# Patient Record
Sex: Male | Born: 1995 | Race: Black or African American | Hispanic: No | Marital: Single | Smoking: Never smoker
Health system: Southern US, Community
[De-identification: ages and names within clinical notes are randomized; demographics above are authoritative.]

## PROBLEM LIST (undated history)

## (undated) DIAGNOSIS — R569 Unspecified convulsions: Secondary | ICD-10-CM

## (undated) DIAGNOSIS — F32A Depression, unspecified: Secondary | ICD-10-CM

## (undated) DIAGNOSIS — F329 Major depressive disorder, single episode, unspecified: Secondary | ICD-10-CM

## (undated) DIAGNOSIS — R040 Epistaxis: Secondary | ICD-10-CM

## (undated) DIAGNOSIS — J45909 Unspecified asthma, uncomplicated: Secondary | ICD-10-CM

---
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
## 2016-02-04 ENCOUNTER — Encounter (HOSPITAL_COMMUNITY): Payer: Self-pay | Admitting: Emergency Medicine

## 2016-02-04 ENCOUNTER — Ambulatory Visit (HOSPITAL_COMMUNITY)
Admission: EM | Admit: 2016-02-04 | Discharge: 2016-02-04 | Disposition: A | Discharge status: Home / Self Care | Payer: Medicaid Other | Attending: Family Medicine | Admitting: Family Medicine

## 2016-02-04 DIAGNOSIS — A084 Viral intestinal infection, unspecified: Secondary | ICD-10-CM | POA: Diagnosis not present

## 2016-02-04 MED ORDER — ONDANSETRON 4 MG PO TBDP
4.0000 mg | ORAL_TABLET | Freq: Once | ORAL | Status: AC
  Administered 2016-02-04: 4 mg via ORAL

## 2016-02-04 MED ORDER — ONDANSETRON 4 MG PO TBDP: 4.0000 mg | ORAL_TABLET | Freq: Three times a day (TID) | ORAL | 0 refills | Status: DC | PRN

## 2016-02-04 MED ORDER — ONDANSETRON 4 MG PO TBDP
ORAL_TABLET | ORAL | Status: AC
  Filled 2016-02-04: qty 1

## 2016-02-04 NOTE — ED Provider Notes (Signed)
CSN: [id number]     Arrival date & time 02/04/16  1149 History   None    Chief Complaint  Patient presents with  . Nausea   (Consider location/radiation/quality/duration/timing/severity/associated sxs/prior Treatment) Patient c/o nausea and vomiting and weakness x 1 day.    Emesis  Severity:  Moderate Duration:  1 day Timing:  Constant Quality:  Undigested food Able to tolerate:  Liquids and solids How soon after eating does vomiting occur:  1 hour Progression:  Worsening Chronicity:  New Recent urination:  Normal Relieved by:  Nothing Worsened by:  Nothing Ineffective treatments:  None tried   Past Medical History:  Diagnosis Date  . Asthma   . Bleeding from the nose   . Seizures (HCC)    History reviewed. No pertinent surgical history. History reviewed. No pertinent family history. Social History  Substance Use Topics  . Smoking status: Passive Smoke Exposure - Never Smoker  . Smokeless tobacco: Never Used  . Alcohol use No    Review of Systems  Constitutional: Positive for fatigue.  HENT: Negative.   Eyes: Negative.   Respiratory: Negative.   Cardiovascular: Negative.   Gastrointestinal: Positive for nausea and vomiting.  Endocrine: Negative.   Genitourinary: Negative.   Musculoskeletal: Negative.   Skin: Negative.   Allergic/Immunologic: Negative.   Neurological: Negative.   Hematological: Negative.   Psychiatric/Behavioral: Negative.     Allergies  Patient has no known allergies.  Home Medications   Prior to Admission medications   Medication Sig Start Date End Date Taking? Authorizing Provider  clonazePAM (KLONOPIN) 0.5 MG tablet Take 0.5 tablets (0.25 mg total) by mouth at bedtime. Patient not taking: Reported on 08/28/2015 05/22/14 08/28/15  Tamika Bush, DO  ibuprofen (ADVIL,MOTRIN) 800 MG tablet Take 1 tablet (800 mg total) by mouth 3 (three) times daily. 08/29/15   Gilda Creasehristopher J Pollina, MD  naproxen (NAPROSYN) 500 MG tablet Take 1 tablet  (500 mg total) by mouth 2 (two) times daily. 12/25/15   Hope Orlene OchM Neese, NP  traMADol (ULTRAM) 50 MG tablet Take 1 tablet (50 mg total) by mouth every 6 (six) hours as needed. 08/29/15   Gilda Creasehristopher J Pollina, MD   Meds Ordered and Administered this Visit  Medications - No data to display  BP 108/72 (BP Location: Left Arm)   Pulse 93   Temp 98 F (36.7 C) (Oral)   Resp 16   SpO2 96%  No data found.   Physical Exam  Constitutional: He is oriented to person, place, and time. He appears well-developed and well-nourished.  HENT:  Head: Normocephalic and atraumatic.  Mouth/Throat: Oropharynx is clear and moist.  Eyes: EOM are normal. Pupils are equal, round, and reactive to light.  Neck: Normal range of motion. Neck supple.  Cardiovascular: Normal rate, regular rhythm and normal heart sounds.   Pulmonary/Chest: Effort normal.  Abdominal: Soft.  Bowel sounds hyperactive.  Musculoskeletal: Normal range of motion.  Neurological: He is alert and oriented to person, place, and time.  Nursing note and vitals reviewed.   Urgent Care Course   Clinical Course     Procedures (including critical care time)  Labs Review Labs Reviewed - No data to display  Imaging Review No results found.   Visual Acuity Review  Right Eye Distance:   Left Eye Distance:   Bilateral Distance:    Right Eye Near:   Left Eye Near:    Bilateral Near:         MDM  Viral Gastroenteritis Zofran odt 4mg   given here in clinic Zofran 4mg  one po tid prn #21 Push po fluids, rest, tylenol and motrin otc prn as directed for fever, arthralgias, and myalgias.  Follow up prn if sx's continue or persist.    Deatra CanterWilliam J Oxford, FNP 02/04/16 1747

## 2016-02-04 NOTE — ED Triage Notes (Signed)
[facility] with a complaint of N/V/D that started at 2:30 am this date. The patient denied any abdominal pain.

## 2016-09-03 ENCOUNTER — Emergency Department (HOSPITAL_COMMUNITY)
Admission: EM | Admit: 2016-09-03 | Discharge: 2016-09-03 | Disposition: A | Discharge status: Home / Self Care | Payer: Medicaid Other | Attending: Emergency Medicine | Admitting: Emergency Medicine

## 2016-09-03 ENCOUNTER — Encounter (HOSPITAL_COMMUNITY): Payer: Self-pay | Admitting: Emergency Medicine

## 2016-09-03 DIAGNOSIS — F4320 Adjustment disorder, unspecified: Secondary | ICD-10-CM | POA: Insufficient documentation

## 2016-09-03 DIAGNOSIS — Z639 Problem related to primary support group, unspecified: Secondary | ICD-10-CM | POA: Insufficient documentation

## 2016-09-03 DIAGNOSIS — Z7722 Contact with and (suspected) exposure to environmental tobacco smoke (acute) (chronic): Secondary | ICD-10-CM | POA: Diagnosis not present

## 2016-09-03 DIAGNOSIS — R45851 Suicidal ideations: Secondary | ICD-10-CM | POA: Insufficient documentation

## 2016-09-03 DIAGNOSIS — J45909 Unspecified asthma, uncomplicated: Secondary | ICD-10-CM | POA: Insufficient documentation

## 2016-09-03 NOTE — ED Notes (Signed)
Belongings placed in locker #29. 

## 2016-09-03 NOTE — ED Provider Notes (Signed)
[facility]-EMERGENCY DEPT Provider Note   CSN: [id number] Arrival date & time: 09/03/16  1720     History   Chief Complaint Chief Complaint  Patient presents with  . Suicidal    HPI Ruben Stone is a 21 y.o. male.  HPI   Patient reports that he has had a difficult relationship for his father for years, and today had a moment when he felt all of his emotions come to a head, began to cry about his relationship with his dad which he reports he had never done until today.  Reports he was upset and went for a walk to think, be outside, and sat on the curb by a bridge. Reports bystanders saw him crying in that location and called [employer], telling him they wanted him to be safe. Reports he was not having suicidal thoughts, however he was grateful to the bystanders because he knows they were looking out for him.  Reports if he feels upset he has coping mechanisms such as playing sports, going for walk, and playing instruments.  Reports no hx of depression, no prior SI, no mental health problems/hospitalization.  Denies medical concerns.  Past Medical History:  Diagnosis Date  . Asthma   . Bleeding from the nose   . Seizures (HCC)     There are no active problems to display for this patient.   History reviewed. No pertinent surgical history.     Home Medications    Prior to Admission medications   Medication Sig Start Date End Date Taking? Authorizing Provider  clonazePAM (KLONOPIN) 0.5 MG tablet Take 0.5 tablets (0.25 mg total) by mouth at bedtime. Patient not taking: Reported on 08/28/2015 05/22/14 08/28/15  Truddie Coco, DO    Family History No family history on file.  Social History Social History  Substance Use Topics  . Smoking status: Passive Smoke Exposure - Never Smoker  . Smokeless tobacco: Never Used  . Alcohol use No     Allergies   Patient has no known allergies.   Review of Systems Review of Systems  Constitutional: Negative for fever.  HENT: Negative for sore  throat.   Eyes: Negative for visual disturbance.  Respiratory: Negative for shortness of breath.   Cardiovascular: Negative for chest pain.  Gastrointestinal: Negative for abdominal pain.  Genitourinary: Negative for difficulty urinating.  Musculoskeletal: Negative for back pain and neck stiffness.  Skin: Negative for rash.  Neurological: Negative for syncope and headaches.  Psychiatric/Behavioral: Negative for self-injury and suicidal ideas.     Physical Exam Updated Vital Signs BP 117/74 (BP Location: Left Arm)   Pulse 76   Temp 98 F (36.7 C) (Oral)   Resp 20   Ht 6\' 1"  (1.854 m)   Wt 59 kg (130 lb)   SpO2 100%   BMI 17.15 kg/m   Physical Exam  Constitutional: He is oriented to person, place, and time. He appears well-developed and well-nourished. No distress.  HENT:  Head: Normocephalic and atraumatic.  Eyes: Conjunctivae and EOM are normal.  Neck: Normal range of motion.  Cardiovascular: Normal rate, regular rhythm, normal heart sounds and intact distal pulses.  Exam reveals no gallop and no friction rub.   No murmur heard. Pulmonary/Chest: Effort normal and breath sounds normal. No respiratory distress. He has no wheezes. He has no rales.  Abdominal: Soft. He exhibits no distension. There is no tenderness. There is no guarding.  Musculoskeletal: He exhibits no edema.  Neurological: He is alert and oriented to person, place, and  time.  Skin: Skin is warm and dry. He is not diaphoretic.  Nursing note and vitals reviewed.    ED Treatments / Results  Labs (all labs ordered are listed, but only abnormal results are displayed) Labs Reviewed - No data to display  EKG  EKG Interpretation None       Radiology No results found.  Procedures Procedures (including critical care time)  Medications Ordered in ED Medications - No data to display   Initial Impression / Assessment and Plan / ED Course  I have reviewed the triage vital signs and the nursing  notes.  Pertinent labs & imaging results that were available during my care of the patient were reviewed by me and considered in my medical decision making (see chart for details).    21yo male with history of asthma, seizures presents after [employer] was called as he was crying on a bridge.  Patient reports he was upset over relationship with his father and was going for a walk.  Denies SI, has no prior hx of SI, no prior hx of attempts or hospitalizations.  Denies SI and also reports motivating factors to live, and appropriate coping mechanisms. Do not feel he is a threat to himself.  Patient contracts for safety. Patient discharged in stable condition with understanding of reasons to return.    Final Clinical Impressions(s) / ED Diagnoses   Final diagnoses:  Emotional crisis  Family dysfunction    New Prescriptions Discharge Medication List as of 09/03/2016  7:56 PM       Alvira MondaySchlossman, [place], MD 09/04/16 1358

## 2016-09-03 NOTE — ED Triage Notes (Signed)
[place] [employer] [place]  Patient reports he was lost and was having a "break down" but is denying suicidal thoughts or plans.  Patient reports he is having a bad relationship with his father and his father is saying things to him that should not be said to a child.  Patient denies HI, SI, and AVH.  Patient calm and cooperative.

## 2016-09-03 NOTE — ED Notes (Signed)
Bed: WA29 Expected date:  Expected time:  Means of arrival:  Comments: 

## 2016-09-03 NOTE — ED Notes (Signed)
Dr. Dalene SeltzerSchlossman in with pt.

## 2017-04-02 ENCOUNTER — Other Ambulatory Visit: Payer: Self-pay

## 2017-04-02 ENCOUNTER — Encounter (HOSPITAL_COMMUNITY): Payer: Self-pay | Admitting: Emergency Medicine

## 2017-04-02 DIAGNOSIS — G40909 Epilepsy, unspecified, not intractable, without status epilepticus: Secondary | ICD-10-CM | POA: Diagnosis present

## 2017-04-02 DIAGNOSIS — F329 Major depressive disorder, single episode, unspecified: Secondary | ICD-10-CM | POA: Diagnosis present

## 2017-04-02 DIAGNOSIS — J45909 Unspecified asthma, uncomplicated: Secondary | ICD-10-CM | POA: Diagnosis present

## 2017-04-02 DIAGNOSIS — K529 Noninfective gastroenteritis and colitis, unspecified: Principal | ICD-10-CM | POA: Diagnosis present

## 2017-04-02 MED ORDER — ONDANSETRON 4 MG PO TBDP: 4.0000 mg | ORAL_TABLET | Freq: Once | ORAL | Status: DC | PRN

## 2017-04-02 NOTE — ED Triage Notes (Signed)
Patient is complaining of abdominal pain. Patient has no vomiting, or diarrhea. Patient is having some nausea. This started an hour ago.

## 2017-04-03 ENCOUNTER — Encounter (HOSPITAL_COMMUNITY): Payer: Self-pay | Admitting: Radiology

## 2017-04-03 ENCOUNTER — Inpatient Hospital Stay (HOSPITAL_COMMUNITY)
Admission: EM | Admit: 2017-04-03 | Discharge: 2017-04-04 | DRG: 392 | Disposition: A | Discharge status: Home / Self Care | Payer: Medicaid Other | Attending: Internal Medicine | Admitting: Internal Medicine

## 2017-04-03 ENCOUNTER — Emergency Department (HOSPITAL_COMMUNITY): Payer: Medicaid Other

## 2017-04-03 DIAGNOSIS — R1115 Cyclical vomiting syndrome unrelated to migraine: Secondary | ICD-10-CM

## 2017-04-03 DIAGNOSIS — J45909 Unspecified asthma, uncomplicated: Secondary | ICD-10-CM | POA: Diagnosis present

## 2017-04-03 DIAGNOSIS — R4182 Altered mental status, unspecified: Secondary | ICD-10-CM

## 2017-04-03 DIAGNOSIS — R569 Unspecified convulsions: Secondary | ICD-10-CM

## 2017-04-03 DIAGNOSIS — Z87898 Personal history of other specified conditions: Secondary | ICD-10-CM

## 2017-04-03 DIAGNOSIS — K529 Noninfective gastroenteritis and colitis, unspecified: Secondary | ICD-10-CM | POA: Diagnosis not present

## 2017-04-03 DIAGNOSIS — R1110 Vomiting, unspecified: Secondary | ICD-10-CM | POA: Diagnosis present

## 2017-04-03 DIAGNOSIS — J4520 Mild intermittent asthma, uncomplicated: Secondary | ICD-10-CM

## 2017-04-03 DIAGNOSIS — R109 Unspecified abdominal pain: Secondary | ICD-10-CM | POA: Diagnosis present

## 2017-04-03 DIAGNOSIS — F329 Major depressive disorder, single episode, unspecified: Secondary | ICD-10-CM | POA: Diagnosis present

## 2017-04-03 DIAGNOSIS — G40909 Epilepsy, unspecified, not intractable, without status epilepticus: Secondary | ICD-10-CM | POA: Diagnosis present

## 2017-04-03 HISTORY — DX: Depression, unspecified: F32.A

## 2017-04-03 HISTORY — DX: Major depressive disorder, single episode, unspecified: F32.9

## 2017-04-03 LAB — RAPID URINE DRUG SCREEN, HOSP PERFORMED
Amphetamines: NOT DETECTED
BARBITURATES: NOT DETECTED
Benzodiazepines: NOT DETECTED
Cocaine: NOT DETECTED
Opiates: POSITIVE — AB
Tetrahydrocannabinol: POSITIVE — AB

## 2017-04-03 LAB — COMPREHENSIVE METABOLIC PANEL
ALK PHOS: 58 U/L (ref 38–126)
ALT: 22 U/L (ref 17–63)
AST: 38 U/L (ref 15–41)
Albumin: 5 g/dL (ref 3.5–5.0)
Anion gap: 12 (ref 5–15)
BUN: 13 mg/dL (ref 6–20)
CALCIUM: 10 mg/dL (ref 8.9–10.3)
CO2: 22 mmol/L (ref 22–32)
CREATININE: 1.11 mg/dL (ref 0.61–1.24)
Chloride: 103 mmol/L (ref 101–111)
GFR calc Af Amer: >60 mL/min (ref >60)
GFR calc non Af Amer: >60 mL/min (ref >60)
Glucose, Bld: 127 mg/dL — ABNORMAL HIGH (ref 65–99)
Potassium: 3.3 mmol/L — ABNORMAL LOW (ref 3.5–5.1)
SODIUM: 137 mmol/L (ref 135–145)
Total Bilirubin: 1.2 mg/dL (ref 0.3–1.2)
Total Protein: 7.9 g/dL (ref 6.5–8.1)

## 2017-04-03 LAB — URINALYSIS, ROUTINE W REFLEX MICROSCOPIC
BILIRUBIN URINE: NEGATIVE
GLUCOSE, UA: NEGATIVE mg/dL
HGB URINE DIPSTICK: NEGATIVE
KETONES UR: NEGATIVE mg/dL
Leukocytes, UA: NEGATIVE
Nitrite: NEGATIVE
PROTEIN: NEGATIVE mg/dL
Specific Gravity, Urine: 1.012 (ref 1.005–1.030)
pH: 6 (ref 5.0–8.0)

## 2017-04-03 LAB — CBC
HCT: 43.2 % (ref 39.0–52.0)
Hemoglobin: 15.8 g/dL (ref 13.0–17.0)
MCH: 30.6 pg (ref 26.0–34.0)
MCHC: 36.6 g/dL — AB (ref 30.0–36.0)
MCV: 83.7 fL (ref 78.0–100.0)
PLATELETS: 195 10*3/uL (ref 150–400)
RBC: 5.16 MIL/uL (ref 4.22–5.81)
RDW: 11.2 % — AB (ref 11.5–15.5)
WBC: 19 10*3/uL — ABNORMAL HIGH (ref 4.0–10.5)

## 2017-04-03 LAB — ETHANOL

## 2017-04-03 LAB — LIPASE, BLOOD: Lipase: 28 U/L (ref 11–51)

## 2017-04-03 MED ORDER — SODIUM CHLORIDE 0.9% FLUSH: 3.0000 mL | INTRAVENOUS | Status: DC | PRN

## 2017-04-03 MED ORDER — SODIUM CHLORIDE 0.9 % IV SOLN
INTRAVENOUS | Status: DC
  Administered 2017-04-03 – 2017-04-04 (×3): via INTRAVENOUS

## 2017-04-03 MED ORDER — IOPAMIDOL (ISOVUE-300) INJECTION 61%
100.0000 mL | Freq: Once | INTRAVENOUS | Status: AC | PRN
  Administered 2017-04-03: 100 mL via INTRAVENOUS

## 2017-04-03 MED ORDER — SODIUM CHLORIDE 0.9 % IV SOLN: 250.0000 mL | INTRAVENOUS | Status: DC | PRN

## 2017-04-03 MED ORDER — METRONIDAZOLE IN NACL 5-0.79 MG/ML-% IV SOLN
500.0000 mg | Freq: Once | INTRAVENOUS | Status: AC
  Administered 2017-04-03: 500 mg via INTRAVENOUS
  Filled 2017-04-03: qty 100

## 2017-04-03 MED ORDER — ACETAMINOPHEN 325 MG PO TABS: 650.0000 mg | ORAL_TABLET | Freq: Four times a day (QID) | ORAL | Status: DC | PRN

## 2017-04-03 MED ORDER — IOPAMIDOL (ISOVUE-300) INJECTION 61%
INTRAVENOUS | Status: AC
  Filled 2017-04-03: qty 100

## 2017-04-03 MED ORDER — SODIUM CHLORIDE 0.9 % IV BOLUS (SEPSIS)
1000.0000 mL | Freq: Once | INTRAVENOUS | Status: AC
  Administered 2017-04-03: 1000 mL via INTRAVENOUS

## 2017-04-03 MED ORDER — LACTATED RINGERS IV BOLUS (SEPSIS)
1000.0000 mL | Freq: Once | INTRAVENOUS | Status: AC
  Administered 2017-04-03: 1000 mL via INTRAVENOUS

## 2017-04-03 MED ORDER — ONDANSETRON HCL 4 MG PO TABS: 4.0000 mg | ORAL_TABLET | Freq: Four times a day (QID) | ORAL | Status: DC | PRN

## 2017-04-03 MED ORDER — TRAZODONE HCL 50 MG PO TABS: 50.0000 mg | ORAL_TABLET | Freq: Every evening | ORAL | Status: DC | PRN

## 2017-04-03 MED ORDER — MORPHINE SULFATE (PF) 2 MG/ML IV SOLN
2.0000 mg | Freq: Once | INTRAVENOUS | Status: AC
  Administered 2017-04-03: 2 mg via INTRAVENOUS
  Filled 2017-04-03: qty 1

## 2017-04-03 MED ORDER — LORAZEPAM 2 MG/ML IJ SOLN
1.0000 mg | Freq: Once | INTRAMUSCULAR | Status: AC
  Administered 2017-04-03: 1 mg via INTRAVENOUS
  Filled 2017-04-03: qty 1

## 2017-04-03 MED ORDER — MORPHINE SULFATE (PF) 4 MG/ML IV SOLN
4.0000 mg | Freq: Once | INTRAVENOUS | Status: AC
  Administered 2017-04-03: 4 mg via INTRAVENOUS
  Filled 2017-04-03: qty 1

## 2017-04-03 MED ORDER — LACTATED RINGERS IV BOLUS (SEPSIS): 1000.0000 mL | Freq: Three times a day (TID) | INTRAVENOUS | Status: DC | PRN

## 2017-04-03 MED ORDER — ONDANSETRON HCL 4 MG/2ML IJ SOLN
4.0000 mg | Freq: Once | INTRAMUSCULAR | Status: AC
  Administered 2017-04-03: 4 mg via INTRAVENOUS
  Filled 2017-04-03: qty 2

## 2017-04-03 MED ORDER — CLONAZEPAM 0.5 MG PO TABS
0.2500 mg | ORAL_TABLET | Freq: Every day | ORAL | Status: DC
  Administered 2017-04-03: 21:00:00 0.25 mg via ORAL
  Filled 2017-04-03: qty 1

## 2017-04-03 MED ORDER — SENNA 8.6 MG PO TABS
1.0000 | ORAL_TABLET | Freq: Two times a day (BID) | ORAL | Status: DC
  Administered 2017-04-03 – 2017-04-04 (×3): 8.6 mg via ORAL
  Filled 2017-04-03 (×2): qty 1

## 2017-04-03 MED ORDER — FLUTICASONE PROPIONATE 50 MCG/ACT NA SUSP
1.0000 | Freq: Every day | NASAL | Status: DC | PRN
  Filled 2017-04-03: qty 16

## 2017-04-03 MED ORDER — MORPHINE SULFATE (PF) 2 MG/ML IV SOLN: 2.0000 mg | INTRAVENOUS | Status: DC | PRN

## 2017-04-03 MED ORDER — ONDANSETRON HCL 4 MG/2ML IJ SOLN: 4.0000 mg | Freq: Four times a day (QID) | INTRAMUSCULAR | Status: DC | PRN

## 2017-04-03 MED ORDER — TRAZODONE HCL 100 MG PO TABS
100.0000 mg | ORAL_TABLET | Freq: Every day | ORAL | Status: DC
  Administered 2017-04-03: 100 mg via ORAL
  Filled 2017-04-03: qty 1

## 2017-04-03 MED ORDER — SODIUM CHLORIDE 0.9% FLUSH
3.0000 mL | Freq: Two times a day (BID) | INTRAVENOUS | Status: DC
  Administered 2017-04-03 (×2): 3 mL via INTRAVENOUS

## 2017-04-03 MED ORDER — ALBUTEROL SULFATE (2.5 MG/3ML) 0.083% IN NEBU: 2.5000 mg | INHALATION_SOLUTION | RESPIRATORY_TRACT | Status: DC | PRN

## 2017-04-03 MED ORDER — HEPARIN SODIUM (PORCINE) 5000 UNIT/ML IJ SOLN
5000.0000 [IU] | Freq: Three times a day (TID) | INTRAMUSCULAR | Status: DC
  Administered 2017-04-03 (×2): 5000 [IU] via SUBCUTANEOUS
  Filled 2017-04-03 (×2): qty 1

## 2017-04-03 MED ORDER — METRONIDAZOLE IN NACL 5-0.79 MG/ML-% IV SOLN
500.0000 mg | Freq: Three times a day (TID) | INTRAVENOUS | Status: DC
  Administered 2017-04-03 – 2017-04-04 (×3): 500 mg via INTRAVENOUS
  Filled 2017-04-03 (×4): qty 100

## 2017-04-03 MED ORDER — ACETAMINOPHEN 650 MG RE SUPP: 650.0000 mg | Freq: Four times a day (QID) | RECTAL | Status: DC | PRN

## 2017-04-03 MED ORDER — POLYETHYLENE GLYCOL 3350 17 G PO PACK: 17.0000 g | PACK | Freq: Every day | ORAL | Status: DC | PRN

## 2017-04-03 MED ORDER — PROMETHAZINE HCL 25 MG/ML IJ SOLN
25.0000 mg | Freq: Once | INTRAMUSCULAR | Status: AC
  Administered 2017-04-03: 25 mg via INTRAVENOUS
  Filled 2017-04-03: qty 1

## 2017-04-03 MED ORDER — DEXTROSE 5 % IV SOLN
2.0000 g | Freq: Once | INTRAVENOUS | Status: AC
  Administered 2017-04-03: 2 g via INTRAVENOUS
  Filled 2017-04-03: qty 2

## 2017-04-03 MED ORDER — LEVOFLOXACIN IN D5W 750 MG/150ML IV SOLN
750.0000 mg | INTRAVENOUS | Status: DC
Start: 2017-04-03 — End: 2017-04-04
  Administered 2017-04-03 – 2017-04-04 (×2): 750 mg via INTRAVENOUS
  Filled 2017-04-03 (×2): qty 150

## 2017-04-03 MED ORDER — LORATADINE 10 MG PO TABS: 10.0000 mg | ORAL_TABLET | Freq: Every day | ORAL | Status: DC | PRN

## 2017-04-03 MED ORDER — METOCLOPRAMIDE HCL 5 MG/ML IJ SOLN
10.0000 mg | Freq: Once | INTRAMUSCULAR | Status: AC
  Administered 2017-04-03: 10 mg via INTRAVENOUS
  Filled 2017-04-03: qty 2

## 2017-04-03 MED ORDER — LACTATED RINGERS IV SOLN
INTRAVENOUS | Status: DC
  Administered 2017-04-03: 09:00:00 via INTRAVENOUS

## 2017-04-03 MED ORDER — CLONAZEPAM 0.5 MG PO TABS: 0.2500 mg | ORAL_TABLET | Freq: Every day | ORAL | Status: DC

## 2017-04-03 MED ORDER — OXYCODONE HCL 5 MG PO TABS: 5.0000 mg | ORAL_TABLET | ORAL | Status: DC | PRN

## 2017-04-03 NOTE — Plan of Care (Signed)
Cathie HoopsRichard Simeone is a 22 year old male with pmh depression, seizures, asthma; who presented with abdominal pain persistent bilious emesis.  VS relatively stable.  Labs revealed WBC 19 and potassium 3.3.  CT scan of the abdomen revealed signs of possible colitis versus early appendicitis.  Dr. Michaell CowingGross of general surgery had been consulted by the ED provider.  Patient was started on empiric antibiotics of Flagyl and ceftriaxone.  [facility] called to admit.  May need to reconsult surgery in a.m. Admit to a MedSurg bed.

## 2017-04-03 NOTE — Consult Note (Addendum)
[place]  [address] [place], [place] [address] Phone: [phone] FAX: [phone]     Ruben Stone  1995/03/17 [id number]  CARE TEAM:  PCP: Patient, No Pcp Per  Outpatient Care Team: Patient Care Team: Patient, No Pcp Per as PCP - General (General Practice)  Inpatient Treatment Team: Treatment Team: Attending Provider: Delora Fuel, MD; Physician Assistant: Agapito Games; Registered Nurse: Markus Daft, RN; Consulting Physician: Edison Pace, Md, MD   This patient is a 23 y.o.male who presents today for surgical evaluation at the request of [facility].   Chief complaint / Reason for evaluation: Abdominal pain.  Possible appendicitis  22 year old young male comes in with crampy abdominal pain persists.  History of history of seizure disorder and.  History of depression.  Had sudden onset of diffuse abdominal pain.  Crankiness.  Began to have emesis.  Patient not really able to give me a history according to physician assistant he had oral intake.  He did drink some liquor, at least 2 shots, before he started vomiting.  He has had negative alcohol levels the past 2 years.  He comes to the emergency room a couple times a year.  Apparently no one else is sick.  No radical change in his diet.  Pain became more intense.  Apparently no fevers or chills.  No headache or neck pain.  No difficulty breathing or chest pain.  No dizziness or syncope.  Came to emergency room.  On initial evaluation had crampy abdominal pain.  Seem more focused in the right lower side.  CT scan showing some edema and lymphadenopathy.  Very thin body habitus so hard to see appendix well.  Possible thickening and inflammation.  Could not rule out appendicitis.  Given the strong clinical suspicion, surgical consultation recommended.  When I came to see him he was sleeping soundly.  He was slow to arouse, resistant being woken up.  Eventually he did  wake up enough.  He then sat up and stood up, trying to ignore me.  He staggered over and urinated in the sink and staggered back into bed.  I was there to try and help steady him and make sure he would not fall.  Patient denied any abdominal pain.  No guarding in his gait.  No difficulty with movement.   Assessment  [place]  22 y.o. male       Problem List:  Active Problems:   Abdominal pain   Emesis   Altered mental status, unspecified   Crampy abdominal pain that seems to have resolved.  History of alcohol intake.  Concerning for alcohol intoxication.  Plan:  He has no abdominal pain.  No guarding.  No discomfort.  In the absence of any pain or guarding or tenderness over McBurney point, I am skeptical that he really has appendicitis.  I would lean more towards mesenteric adenitis, food poisoning, alcohol poisoning, or gastroenteritis, but he does not seem to have much discomfort now.  Should he have more focused right-sided abdominal pain, can revisit the hypothesis of appendicitis, but appendicitis progresses and worsens.  It does not resolve.  We will sign off.  Reconsult as needed.  Depressed altered mental status concerning for some level of intoxication.  Seems a challenge in a patient with history of seizures.  Defer emergency department  to rule out atypical seizure.  Consider alcohol and drug level checks and reevaluation.  Make sure there is nothing else  going on.    No evidence of any injury.  No focal neurological deficits concerning for stroke or injury  IV fluids.  Probably observe until he recovers or declares his diagnosis better  Discussed with Dr. Roxanne Mins in the emergency department.    45 minutes spent in review, evaluation, examination, counseling, and coordination of care.  More than 50% of that time was spent in counseling.  [place], M.D., F.A.C.S. Gastrointestinal and Minimally Invasive Surgery [facility] [address], [place], [place] [address] [phone] Main / Paging   04/03/2017      Past Medical History:  Diagnosis Date  . Asthma   . Bleeding from the nose   . Seizures ([place])     History reviewed. No pertinent surgical history.  Social History   Socioeconomic History  . Marital status: Single    Spouse name: Not on file  . Number of children: Not on file  . Years of education: Not on file  . Highest education level: Not on file  Social Needs  . Financial resource strain: Not on file  . Food insecurity - worry: Not on file  . Food insecurity - inability: Not on file  . Transportation needs - medical: Not on file  . Transportation needs - non-medical: Not on file  Occupational History  . Not on file  Tobacco Use  . Smoking status: Passive Smoke Exposure - Never Smoker  . Smokeless tobacco: Never Used  Substance and Sexual Activity  . Alcohol use: No  . Drug use: No  . Sexual activity: Not on file  Other Topics Concern  . Not on file  Social History Narrative  . Not on file    History reviewed. No pertinent family history.  Current Facility-Administered Medications  Medication Dose Route Frequency Provider Last Rate Last Dose  . cefTRIAXone (ROCEPHIN) 2 g in dextrose 5 % 50 mL IVPB  2 g Intravenous Once Muthersbaugh, Jarrett Soho, PA-C       And  . metroNIDAZOLE (FLAGYL) IVPB 500 mg  500 mg Intravenous Once Muthersbaugh, Hannah, PA-C      . lactated ringers bolus 1,000 mL  1,000 mL Intravenous Once Chandell Attridge, Remo Lipps, MD      . lactated ringers bolus 1,000 mL  1,000 mL Intravenous Q8H PRN Michael [place], MD       Current Outpatient Medications  Medication Sig Dispense Refill  . fluticasone (FLONASE) 50 MCG/ACT nasal spray Place 1 spray into both nostrils daily as needed for allergies or rhinitis.    Marland Kitchen loratadine (CLARITIN) 10 MG tablet Take 10 mg by mouth daily as needed for allergies.    . clonazePAM (KLONOPIN) 0.5 MG tablet Take 0.5 tablets (0.25 mg total) by  mouth at bedtime. (Patient not taking: Reported on 08/28/2015) 7 tablet 0     No Known Allergies  ROS:   As noted per HPI.  Patient would not answer questions related to review of systems, but he denied abdominal pain.  Denies any diarrhea.  Denies any sick contacts.  Denied any subjective fevers or chills.  Denies any nausea at the time..  Defer to review systems provided by nursing and physician assistant on earlier evaluation.   BP 139/86 (BP Location: Left Arm)   Pulse 75   Temp 99.4 F (37.4 C) (Oral)   Resp 18   Ht 6' (1.829 m)   Wt 63.5 kg (140 lb)   SpO2 100%   BMI 18.99 kg/m  Physical Exam: General: Patient soundly sleeping.  Eventually was able to arouse.  Oriented to self.  Groggy but appears to be in no acute distress.  Looks very drunk Eyes: PERRL, normal EOM. Sclera little injected but nonicteric Neuro: CN II-XII intact w/o focal sensory/motor deficits.  Unsteady gait suspicious for intoxication. Lymph: No head/neck/groin lymphadenopathy Psych:  No delerium/psychosis/paranoia.  Withdrawn.  Not particularly interactive but does eventually answer questions. HENT: Normocephalic, Mucus membranes dry.  No thrush Neck: Supple, No tracheal deviation Chest: No pain.  Good respiratory excursion. CV:  Pulses intact.  Regular rhythm Abdomen: Soft, Nondistended.  Nontender.  No guarding with bed shake or standing up.  No pain to light or deep palpation or percussion.  No rebound tenderness.  No umbilical hernia.  No incarcerated hernias. Gen:  No inguinal hernias.  No inguinal lymphadenopathy.  Urinates without difficulty. Ext:  SCDs BLE.  No significant edema.  No cyanosis Skin: No petechiae / purpurea.  No major sores Musculoskeletal: No severe joint pain.  Good ROM major joints   Results:   Labs: Results for orders placed or performed during the hospital encounter of 04/03/17 (from the past 48 hour(s))  Lipase, blood     Status: None   Collection Time: 04/03/17 12:46 AM   Result Value Ref Range   Lipase 28 11 - 51 U/L  Comprehensive metabolic panel     Status: Abnormal   Collection Time: 04/03/17 12:46 AM  Result Value Ref Range   Sodium 137 135 - 145 mmol/L   Potassium 3.3 (L) 3.5 - 5.1 mmol/L   Chloride 103 101 - 111 mmol/L   CO2 22 22 - 32 mmol/L   Glucose, Bld 127 (H) 65 - 99 mg/dL   BUN 13 6 - 20 mg/dL   Creatinine, Ser 1.11 0.61 - 1.24 mg/dL   Calcium 10.0 8.9 - 10.3 mg/dL   Total Protein 7.9 6.5 - 8.1 g/dL   Albumin 5.0 3.5 - 5.0 g/dL   AST 38 15 - 41 U/L   ALT 22 17 - 63 U/L   Alkaline Phosphatase 58 38 - 126 U/L   Total Bilirubin 1.2 0.3 - 1.2 mg/dL   GFR calc non Af Amer >60 >60 mL/min   GFR calc Af Amer >60 >60 mL/min    Comment: (NOTE) The eGFR has been calculated using the CKD EPI equation. This calculation has not been validated in all clinical situations. eGFR's persistently <60 mL/min signify possible Chronic Kidney Disease.    Anion gap 12 5 - 15  CBC     Status: Abnormal   Collection Time: 04/03/17 12:46 AM  Result Value Ref Range   WBC 19.0 (H) 4.0 - 10.5 K/uL   RBC 5.16 4.22 - 5.81 MIL/uL   Hemoglobin 15.8 13.0 - 17.0 g/dL   HCT 43.2 39.0 - 52.0 %   MCV 83.7 78.0 - 100.0 fL   MCH 30.6 26.0 - 34.0 pg   MCHC 36.6 (H) 30.0 - 36.0 g/dL   RDW 11.2 (L) 11.5 - 15.5 %   Platelets 195 150 - 400 K/uL    Imaging / Studies: Ct Abdomen Pelvis W Contrast  Result Date: 04/03/2017 CLINICAL DATA:  22 year old male with abdominal pain and nausea. EXAM: CT ABDOMEN AND PELVIS WITH CONTRAST TECHNIQUE: Multidetector CT imaging of the abdomen and pelvis was performed using the standard protocol following bolus administration of intravenous contrast. CONTRAST:  129m ISOVUE-300 IOPAMIDOL (ISOVUE-300) INJECTION 61% COMPARISON:  Abdominal CT dated 08/29/2015 FINDINGS: Lower chest: The  visualized lung bases are clear. No intra-abdominal free air. There is a small free fluid within the pelvis. Hepatobiliary: No focal liver abnormality is seen.  No gallstones, gallbladder wall thickening, or biliary dilatation. Pancreas: Unremarkable. No pancreatic ductal dilatation or surrounding inflammatory changes. Spleen: Normal in size without focal abnormality. Adrenals/Urinary Tract: The adrenal glands, kidneys, and the visualized ureters appear unremarkable. There is apparent diffuse thickening of the bladder wall which may be partly related to underdistention. Cystitis is not excluded. Correlation with urinalysis recommended. Stomach/Bowel: Evaluation of the bowel is limited in the absence of oral contrast. Diffusely thickened appearance of the colon, likely related to underdistention. Colitis is less likely. Clinical correlation is recommended. There is no bowel obstruction. The appendix is suboptimally visualized. There appears to be a small amount of fluid in the visualized proximal and midportion of the appendix. The visualized appendix measure up to 9 mm. The distal portion of the appendix is poorly visualized but appear to extend is posteriorly along the right lateral pelvic sidewall. There is mild stranding of the fat surrounding the appendix which may be related to general mesenteric edema seen throughout the abdomen. An early acute appendicitis is not excluded. Clinical correlation is recommended. Vascular/Lymphatic: No significant vascular findings are present. No enlarged abdominal or pelvic lymph nodes. Reproductive: The prostate and seminal vesicles are grossly unremarkable. Other: None Musculoskeletal: No acute or significant osseous findings. IMPRESSION: Mild diffuse mesenteric edema and small amount of free fluid within the pelvis of indeterminate etiology. Apparent mild diffuse thickening of the colon likely related to underdistention. Clinical correlation is recommended to exclude colitis. No bowel obstruction. Suboptimal evaluation of the appendix. Mild periappendiceal stranding possibly secondary to diffuse mesenteric edema. An early acute  appendicitis is not entirely excluded. Clinical correlation is recommended. Electronically Signed   By: Anner Crete M.D.   On: 04/03/2017 02:53    Medications / Allergies: per chart  Antibiotics: Anti-infectives (From admission, onward)   Start     Dose/Rate Route Frequency Ordered Stop   04/03/17 0530  cefTRIAXone (ROCEPHIN) 2 g in dextrose 5 % 50 mL IVPB     2 g 100 mL/hr over 30 Minutes Intravenous  Once 04/03/17 0521     04/03/17 0530  metroNIDAZOLE (FLAGYL) IVPB 500 mg     500 mg 100 mL/hr over 60 Minutes Intravenous  Once 04/03/17 9741          Note: Portions of this report may have been transcribed using voice recognition software. Every effort was made to ensure accuracy; however, inadvertent computerized transcription errors may be present.   Any transcriptional errors that result from this process are unintentional.    [place], M.D., F.A.C.S. Gastrointestinal and Minimally Invasive Surgery [facility] [address], [place], [place] [address] [phone] Main / Paging   04/03/2017

## 2017-04-03 NOTE — ED Notes (Signed)
Report called top [employer] RN

## 2017-04-03 NOTE — ED Notes (Signed)
[facility] Expected date:  Expected time:  Means of arrival:  Comments: HELD

## 2017-04-03 NOTE — ED Notes (Signed)
PATIENT NOT COOPERATIVE TWO ATTEMPTS TO COLLECT BLOOD SAMPLES BOTH WERE SUCCESSFUL PATIENT PULLS AWAY PULLING NEEDLE OUT BOTH TIMES

## 2017-04-03 NOTE — H&P (Signed)
Patient Demographics:    Ruben Stone, is a 22 y.o. male  MRN: [id number]   DOB - 19-May-1995  Admit Date - 04/03/2017  Outpatient Primary MD for the patient is Patient, No Pcp Per   Assessment & Plan:    Principal Problem:   Abdominal pain Active Problems:   Emesis   Altered mental status, unspecified   History of seizures    1)Abd Pain-concerns about possible acute colitis, leukocytosis noted, surgical consult from Dr. Michaell CowingGross appreciated, check serial CBCs, serial abdominal exams, at this time low index of suspicion for acute appendicitis, treat empirically with Levaquin and Flagyl for possible colitis.  Blood cultures ordered but patient has not been cooperative with blood draws  2)H/o Depression-affect is flat, patient denies suicidal or homicidal ideation or plan, patient is not often cooperative with staff, He is not violent , he is not agitated, continue Klonopin and trazodone    With History of - Reviewed by me  Past Medical History:  Diagnosis Date  . Asthma   . Bleeding from the nose   . Depression   . Seizures (HCC)       History reviewed. No pertinent surgical history.    Chief Complaint  Patient presents with  . Abdominal Pain      HPI:    Ruben Stone  is a 22 y.o. male with past medical history relevant for depression, seizure disorder tobacco and THC use who presents to the ED with sudden onset of abdominal pain couple of hours prior to arrival in the ED. abdominal pain started after patient drank 2 shots of liquor.  She denies excessive alcohol use on a regular basis patient had few episodes of bilious emesis, no blood.  Denies diarrhea, no sick contacts at home, no fevers or chills  In ED... Patient is found to have a white count of 19,000, CT abdomen and pelvis with concerns about  possible colitis and inability to rule out acute appendicitis, surgical consult obtained by ED provider Dr. Michaell CowingGross from general surgery believes there is little likelihood of acute appendicitis .  At the time of my evaluation abdominal pain is much improved, patient is intermittently cooperative, he often refuses blood draws  In ED... Patient was started empirically on Flagyl and Rocephin  CT Abd/Pelvis shows- Mild diffuse mesenteric edema and small amount of free fluid within the pelvis of indeterminate etiology. Apparent mild diffuse thickening of the colon likely related to underdistention. Clinical correlation is recommended to exclude colitis. No bowel obstruction. Suboptimal evaluation of the appendix.   Review of systems:    In addition to the HPI above,   A full 12 point Review of 10 Systems was done, except as stated above, all other Review of 10 Systems were negative.   Social History:  Reviewed by me   Social History   Tobacco Use  . Smoking status: Passive Smoke Exposure - Never Smoker  . Smokeless tobacco: Never Used  Substance Use Topics  . Alcohol use: No    Family History :  Reviewed by me   HTN   Home Medications:   Prior to Admission medications   Medication Sig Start Date End Date Taking? Authorizing Provider  fluticasone (FLONASE) 50 MCG/ACT nasal spray Place 1 spray into both nostrils daily as needed for allergies or rhinitis.   Yes [provider]  loratadine (CLARITIN) 10 MG tablet Take 10 mg by mouth daily as needed for allergies.   Yes [provider]  clonazePAM (KLONOPIN) 0.5 MG tablet Take 0.5 tablets (0.25 mg total) by mouth at bedtime. Patient not taking: Reported on 08/28/2015 05/22/14 08/28/15  Truddie Coco, DO     Allergies:    No Known Allergies   Physical Exam:   Vitals  Blood pressure 117/64, pulse 75, temperature 99.4 F (37.4 C), temperature source Oral, resp. rate 14, height 6' (1.829 m), weight 63.5 kg (140 lb),  SpO2 100 %.  Physical Examination: General appearance - alert, well appearing, and in no distress  Mental status - alert, oriented to person, place, and time,  Eyes - sclera anicteric Neck - supple, no JVD elevation , Chest - clear  to auscultation bilaterally, symmetrical air movement,  Heart - S1 and S2 normal,  Abdomen - soft,  nondistended, no masses or organomegaly, almost completely resolved lower abdominal and epigastric area tenderness, no rebound or guarding, no CVA area tenderness Neurological - screening mental status exam normal, neck supple without rigidity, cranial nerves II through XII intact, DTR's normal and symmetric Extremities - no pedal edema noted, intact peripheral pulses  Skin - warm, dry Psych-affect is flat, patient is not always cooperative   Data Review:    CBC Recent Labs  Lab 04/03/17 0046  WBC 19.0*  HGB 15.8  HCT 43.2  PLT 195  MCV 83.7  MCH 30.6  MCHC 36.6*  RDW 11.2*   ------------------------------------------------------------------------------------------------------------------  Chemistries  Recent Labs  Lab 04/03/17 0046  NA 137  K 3.3*  CL 103  CO2 22  GLUCOSE 127*  BUN 13  CREATININE 1.11  CALCIUM 10.0  AST 38  ALT 22  ALKPHOS 58  BILITOT 1.2   ------------------------------------------------------------------------------------------------------------------ estimated creatinine clearance is 95.3 mL/min (by C-G formula based on SCr of 1.11 mg/dL). ------------------------------------------------------------------------------------------------------------------ No results for input(s): TSH, T4TOTAL, T3FREE, THYROIDAB in the last 72 hours.  Invalid input(s): FREET3   Coagulation profile No results for input(s): INR, PROTIME in the last 168 hours. ------------------------------------------------------------------------------------------------------------------- No results for input(s): DDIMER in the last 72  hours. -------------------------------------------------------------------------------------------------------------------  Cardiac Enzymes No results for input(s): CKMB, TROPONINI, MYOGLOBIN in the last 168 hours.  Invalid input(s): CK ------------------------------------------------------------------------------------------------------------------ No results found for: BNP   ---------------------------------------------------------------------------------------------------------------  Urinalysis    Component Value Date/Time   COLORURINE STRAW (A) 04/03/2017 0817   APPEARANCEUR CLEAR 04/03/2017 0817   LABSPEC 1.012 04/03/2017 0817   PHURINE 6.0 04/03/2017 0817   GLUCOSEU NEGATIVE 04/03/2017 0817   HGBUR NEGATIVE 04/03/2017 0817   BILIRUBINUR NEGATIVE 04/03/2017 0817   KETONESUR NEGATIVE 04/03/2017 0817   PROTEINUR NEGATIVE 04/03/2017 0817   NITRITE NEGATIVE 04/03/2017 0817   LEUKOCYTESUR NEGATIVE 04/03/2017 0817    ----------------------------------------------------------------------------------------------------------------   Imaging Results:    Ct Abdomen Pelvis W Contrast  Result Date: 04/03/2017 CLINICAL DATA:  22 year old male with abdominal pain and nausea. EXAM: CT ABDOMEN AND PELVIS WITH CONTRAST TECHNIQUE: Multidetector CT imaging of the abdomen and pelvis was performed using the standard protocol following bolus administration of intravenous contrast.  CONTRAST:  ISOVUE-300 IOPAMIDOL (ISOVUE-300) INJECTION 61% COMPARISON:  Abdominal CT dated 08/29/2015 FINDINGS: Lower chest: The visualized lung bases are clear. No intra-abdominal free air. There is a small free fluid within the pelvis. Hepatobiliary: No focal liver abnormality is seen. No gallstones, gallbladder wall thickening, or biliary dilatation. Pancreas: Unremarkable. No pancreatic ductal dilatation or surrounding inflammatory changes. Spleen: Normal in size without focal abnormality. Adrenals/Urinary  Tract: The adrenal glands, kidneys, and the visualized ureters appear unremarkable. There is apparent diffuse thickening of the bladder wall which may be partly related to underdistention. Cystitis is not excluded. Correlation with urinalysis recommended. Stomach/Bowel: Evaluation of the bowel is limited in the absence of oral contrast. Diffusely thickened appearance of the colon, likely related to underdistention. Colitis is less likely. Clinical correlation is recommended. There is no bowel obstruction. The appendix is suboptimally visualized. There appears to be a small amount of fluid in the visualized proximal and midportion of the appendix. The visualized appendix measure up to 9 mm. The distal portion of the appendix is poorly visualized but appear to extend is posteriorly along the right lateral pelvic sidewall. There is mild stranding of the fat surrounding the appendix which may be related to general mesenteric edema seen throughout the abdomen. An early acute appendicitis is not excluded. Clinical correlation is recommended. Vascular/Lymphatic: No significant vascular findings are present. No enlarged abdominal or pelvic lymph nodes. Reproductive: The prostate and seminal vesicles are grossly unremarkable. Other: None Musculoskeletal: No acute or significant osseous findings. IMPRESSION: Mild diffuse mesenteric edema and small amount of free fluid within the pelvis of indeterminate etiology. Apparent mild diffuse thickening of the colon likely related to underdistention. Clinical correlation is recommended to exclude colitis. No bowel obstruction. Suboptimal evaluation of the appendix. Mild periappendiceal stranding possibly secondary to diffuse mesenteric edema. An early acute appendicitis is not entirely excluded. Clinical correlation is recommended. Electronically Signed   By: Elgie Collard M.D.   On: 04/03/2017 02:53    Radiological Exams on Admission: Ct Abdomen Pelvis W Contrast  Result  Date: 04/03/2017 CLINICAL DATA:  22 year old male with abdominal pain and nausea. EXAM: CT ABDOMEN AND PELVIS WITH CONTRAST TECHNIQUE: Multidetector CT imaging of the abdomen and pelvis was performed using the standard protocol following bolus administration of intravenous contrast. CONTRAST:  ISOVUE-300 IOPAMIDOL (ISOVUE-300) INJECTION 61% COMPARISON:  Abdominal CT dated 08/29/2015 FINDINGS: Lower chest: The visualized lung bases are clear. No intra-abdominal free air. There is a small free fluid within the pelvis. Hepatobiliary: No focal liver abnormality is seen. No gallstones, gallbladder wall thickening, or biliary dilatation. Pancreas: Unremarkable. No pancreatic ductal dilatation or surrounding inflammatory changes. Spleen: Normal in size without focal abnormality. Adrenals/Urinary Tract: The adrenal glands, kidneys, and the visualized ureters appear unremarkable. There is apparent diffuse thickening of the bladder wall which may be partly related to underdistention. Cystitis is not excluded. Correlation with urinalysis recommended. Stomach/Bowel: Evaluation of the bowel is limited in the absence of oral contrast. Diffusely thickened appearance of the colon, likely related to underdistention. Colitis is less likely. Clinical correlation is recommended. There is no bowel obstruction. The appendix is suboptimally visualized. There appears to be a small amount of fluid in the visualized proximal and midportion of the appendix. The visualized appendix measure up to 9 mm. The distal portion of the appendix is poorly visualized but appear to extend is posteriorly along the right lateral pelvic sidewall. There is mild stranding of the fat surrounding the appendix which may be related to general mesenteric  edema seen throughout the abdomen. An early acute appendicitis is not excluded. Clinical correlation is recommended. Vascular/Lymphatic: No significant vascular findings are present. No enlarged abdominal or  pelvic lymph nodes. Reproductive: The prostate and seminal vesicles are grossly unremarkable. Other: None Musculoskeletal: No acute or significant osseous findings. IMPRESSION: Mild diffuse mesenteric edema and small amount of free fluid within the pelvis of indeterminate etiology. Apparent mild diffuse thickening of the colon likely related to underdistention. Clinical correlation is recommended to exclude colitis. No bowel obstruction. Suboptimal evaluation of the appendix. Mild periappendiceal stranding possibly secondary to diffuse mesenteric edema. An early acute appendicitis is not entirely excluded. Clinical correlation is recommended. Electronically Signed   By: Elgie Collard M.D.   On: 04/03/2017 02:53    DVT Prophylaxis -SCD  /heparin AM Labs Ordered, also please review Full Orders  Family Communication: Admission, patients condition and plan of care including tests being ordered have been discussed with the patient and who indicate understanding and agree with the plan   Code Status - Full Code  Likely DC to  Home   Condition  - stable  Shon Hale M.D on 04/03/2017 at 10:05 AM  Between 7am to 7pm - Pager - [phone] After 7pm go to [email or web address] - password [email or web address]  [facility] - Office  [phone]  Voice Recognition Reubin Milan dictation system was used to create this note, attempts have been made to correct errors. Please contact the author with questions and/or clarifications.

## 2017-04-03 NOTE — ED Notes (Signed)
ED TO INPATIENT HANDOFF REPORT  Name/Age/Gender Ruben Stone 22 y.o. male  Code Status    Code Status Orders  (From admission, onward)        Start     Ordered   04/03/17 1023  Full code  Continuous     04/03/17 1022    Code Status History    Date Active Date Inactive Code Status Order ID Comments User Context   This patient has a current code status but no historical code status.      Home/SNF/Other Home  Chief Complaint stomach problems   Level of Care/Admitting Diagnosis ED Disposition    ED Disposition Condition Comment   Admit  Hospital Area: [facility] [100102]  Level of Care: Med-Surg [16]  Diagnosis: Abdominal pain [716967]  Admitting Physician: Morrison Old  Attending Physician: Morrison Old  Estimated length of stay: 3 - 4 days  Certification:: I certify this patient will need inpatient services for at least 2 midnights  Bed request comments: med  PT Class (Do Not Modify): Inpatient [101]  PT Acc Code (Do Not Modify): Private [1]       Medical History Past Medical History:  Diagnosis Date  . Asthma   . Bleeding from the nose   . Depression   . Seizures ([place])     Allergies No Known Allergies  IV Location/Drains/Wounds Patient Lines/Drains/Airways Status   Active Line/Drains/Airways    Name:   Placement date:   Placement time:   Site:   Days:   Peripheral IV 04/03/17 Right Forearm   04/03/17    0056    Forearm   less than 1          Labs/Imaging Results for orders placed or performed during the hospital encounter of 04/03/17 (from the past 48 hour(s))  Lipase, blood     Status: None   Collection Time: 04/03/17 12:46 AM  Result Value Ref Range   Lipase 28 11 - 51 U/L  Comprehensive metabolic panel     Status: Abnormal   Collection Time: 04/03/17 12:46 AM  Result Value Ref Range   Sodium 137 135 - 145 mmol/L   Potassium 3.3 (L) 3.5 - 5.1 mmol/L   Chloride 103 101 - 111 mmol/L   CO2  22 22 - 32 mmol/L   Glucose, Bld 127 (H) 65 - 99 mg/dL   BUN 13 6 - 20 mg/dL   Creatinine, Ser 1.11 0.61 - 1.24 mg/dL   Calcium 10.0 8.9 - 10.3 mg/dL   Total Protein 7.9 6.5 - 8.1 g/dL   Albumin 5.0 3.5 - 5.0 g/dL   AST 38 15 - 41 U/L   ALT 22 17 - 63 U/L   Alkaline Phosphatase 58 38 - 126 U/L   Total Bilirubin 1.2 0.3 - 1.2 mg/dL   GFR calc non Af Amer >60 >60 mL/min   GFR calc Af Amer >60 >60 mL/min    Comment: (NOTE) The eGFR has been calculated using the CKD EPI equation. This calculation has not been validated in all clinical situations. eGFR's persistently <60 mL/min signify possible Chronic Kidney Disease.    Anion gap 12 5 - 15  CBC     Status: Abnormal   Collection Time: 04/03/17 12:46 AM  Result Value Ref Range   WBC 19.0 (H) 4.0 - 10.5 K/uL   RBC 5.16 4.22 - 5.81 MIL/uL   Hemoglobin 15.8 13.0 - 17.0 g/dL   HCT 43.2 39.0 - 52.0 %  MCV 83.7 78.0 - 100.0 fL   MCH 30.6 26.0 - 34.0 pg   MCHC 36.6 (H) 30.0 - 36.0 g/dL   RDW 11.2 (L) 11.5 - 15.5 %   Platelets 195 150 - 400 K/uL  Urinalysis, Routine w reflex microscopic     Status: Abnormal   Collection Time: 04/03/17  8:17 AM  Result Value Ref Range   Color, Urine STRAW (A) YELLOW   APPearance CLEAR CLEAR   Specific Gravity, Urine 1.012 1.005 - 1.030   pH 6.0 5.0 - 8.0   Glucose, UA NEGATIVE NEGATIVE mg/dL   Hgb urine dipstick NEGATIVE NEGATIVE   Bilirubin Urine NEGATIVE NEGATIVE   Ketones, ur NEGATIVE NEGATIVE mg/dL   Protein, ur NEGATIVE NEGATIVE mg/dL   Nitrite NEGATIVE NEGATIVE   Leukocytes, UA NEGATIVE NEGATIVE  Urine rapid drug screen (hosp performed)     Status: Abnormal   Collection Time: 04/03/17  8:17 AM  Result Value Ref Range   Opiates POSITIVE (A) NONE DETECTED   Cocaine NONE DETECTED NONE DETECTED   Benzodiazepines NONE DETECTED NONE DETECTED   Amphetamines NONE DETECTED NONE DETECTED   Tetrahydrocannabinol POSITIVE (A) NONE DETECTED   Barbiturates NONE DETECTED NONE DETECTED    Comment:  (NOTE) DRUG SCREEN FOR MEDICAL PURPOSES ONLY.  IF CONFIRMATION IS NEEDED FOR ANY PURPOSE, NOTIFY LAB WITHIN 5 DAYS. LOWEST DETECTABLE LIMITS FOR URINE DRUG SCREEN Drug Class                     Cutoff (ng/mL) Amphetamine and metabolites    1000 Barbiturate and metabolites    200 Benzodiazepine                 409 Tricyclics and metabolites     300 Opiates and metabolites        300 Cocaine and metabolites        300 THC                            50    Ct Abdomen Pelvis W Contrast  Result Date: 04/03/2017 CLINICAL DATA:  22 year old male with abdominal pain and nausea. EXAM: CT ABDOMEN AND PELVIS WITH CONTRAST TECHNIQUE: Multidetector CT imaging of the abdomen and pelvis was performed using the standard protocol following bolus administration of intravenous contrast. CONTRAST:  129m ISOVUE-300 IOPAMIDOL (ISOVUE-300) INJECTION 61% COMPARISON:  Abdominal CT dated 08/29/2015 FINDINGS: Lower chest: The visualized lung bases are clear. No intra-abdominal free air. There is a small free fluid within the pelvis. Hepatobiliary: No focal liver abnormality is seen. No gallstones, gallbladder wall thickening, or biliary dilatation. Pancreas: Unremarkable. No pancreatic ductal dilatation or surrounding inflammatory changes. Spleen: Normal in size without focal abnormality. Adrenals/Urinary Tract: The adrenal glands, kidneys, and the visualized ureters appear unremarkable. There is apparent diffuse thickening of the bladder wall which may be partly related to underdistention. Cystitis is not excluded. Correlation with urinalysis recommended. Stomach/Bowel: Evaluation of the bowel is limited in the absence of oral contrast. Diffusely thickened appearance of the colon, likely related to underdistention. Colitis is less likely. Clinical correlation is recommended. There is no bowel obstruction. The appendix is suboptimally visualized. There appears to be a small amount of fluid in the visualized proximal and  midportion of the appendix. The visualized appendix measure up to 9 mm. The distal portion of the appendix is poorly visualized but appear to extend is posteriorly along the right lateral pelvic sidewall. There is mild stranding  of the fat surrounding the appendix which may be related to general mesenteric edema seen throughout the abdomen. An early acute appendicitis is not excluded. Clinical correlation is recommended. Vascular/Lymphatic: No significant vascular findings are present. No enlarged abdominal or pelvic lymph nodes. Reproductive: The prostate and seminal vesicles are grossly unremarkable. Other: None Musculoskeletal: No acute or significant osseous findings. IMPRESSION: Mild diffuse mesenteric edema and small amount of free fluid within the pelvis of indeterminate etiology. Apparent mild diffuse thickening of the colon likely related to underdistention. Clinical correlation is recommended to exclude colitis. No bowel obstruction. Suboptimal evaluation of the appendix. Mild periappendiceal stranding possibly secondary to diffuse mesenteric edema. An early acute appendicitis is not entirely excluded. Clinical correlation is recommended. Electronically Signed   By: Anner Crete M.D.   On: 04/03/2017 02:53    Pending Labs Unresulted Labs (From admission, onward)   Start     Ordered   04/04/17 6168  Basic metabolic panel  Tomorrow morning,   R     04/03/17 1022   04/04/17 0500  CBC  Tomorrow morning,   R     04/03/17 1022   04/03/17 1023  HIV antibody (Routine Testing)  Once,   R     04/03/17 1022   04/03/17 1004  Culture, blood (Routine X 2) w Reflex to ID Panel  BLOOD CULTURE X 2,   R    Comments:  Blood cultures x 2 from peripheral line   Question:  Patient immune status  Answer:  Normal   04/03/17 1003   04/03/17 0609  Ethanol  Once,   R     04/03/17 0608      Vitals/Pain Today's Vitals   04/03/17 0612 04/03/17 0800 04/03/17 0955 04/03/17 1309  BP:  117/64 117/64 118/72   Pulse:  75 75 72  Resp:  '14 14 16  '$ Temp:      TempSrc:      SpO2:  100% 100% 100%  Weight:      Height:      PainSc: 0-No pain       Isolation Precautions No active isolations  Medications Medications  lactated ringers bolus 1,000 mL (not administered)  fluticasone (FLONASE) 50 MCG/ACT nasal spray 1 spray (not administered)  loratadine (CLARITIN) tablet 10 mg (not administered)  lactated ringers infusion ( Intravenous Stopped 04/03/17 1417)  morphine 2 MG/ML injection 2 mg (not administered)  ondansetron (ZOFRAN) injection 4 mg (not administered)  clonazePAM (KLONOPIN) tablet 0.25 mg (not administered)  sodium chloride flush (NS) 0.9 % injection 3 mL (3 mLs Intravenous Given 04/03/17 1037)  sodium chloride flush (NS) 0.9 % injection 3 mL (not administered)  0.9 %  sodium chloride infusion (not administered)  acetaminophen (TYLENOL) tablet 650 mg (not administered)    Or  acetaminophen (TYLENOL) suppository 650 mg (not administered)  senna (SENOKOT) tablet 8.6 mg (8.6 mg Oral Given 04/03/17 1157)  polyethylene glycol (MIRALAX / GLYCOLAX) packet 17 g (not administered)  ondansetron (ZOFRAN) tablet 4 mg (not administered)    Or  ondansetron (ZOFRAN) injection 4 mg (not administered)  albuterol (PROVENTIL) (2.5 MG/3ML) 0.083% nebulizer solution 2.5 mg (not administered)  heparin injection 5,000 Units (not administered)  0.9 %  sodium chloride infusion ( Intravenous Stopped 04/03/17 1417)  morphine 2 MG/ML injection 2 mg (not administered)  oxyCODONE (Oxy IR/ROXICODONE) immediate release tablet 5 mg (not administered)  levofloxacin (LEVAQUIN) IVPB 750 mg (0 mg Intravenous Stopped 04/03/17 1237)  metroNIDAZOLE (FLAGYL) IVPB 500 mg (not administered)  traZODone (DESYREL) tablet 100 mg (not administered)  sodium chloride 0.9 % bolus 1,000 mL (0 mLs Intravenous Stopped 04/03/17 0612)  morphine 2 MG/ML injection 2 mg (2 mg Intravenous Given 04/03/17 0052)  ondansetron (ZOFRAN) injection 4  mg (4 mg Intravenous Given 04/03/17 0052)  promethazine (PHENERGAN) injection 25 mg (25 mg Intravenous Given 04/03/17 0133)  metoCLOPramide (REGLAN) injection 10 mg (10 mg Intravenous Given 04/03/17 0317)  iopamidol (ISOVUE-300) 61 % injection 100 mL (100 mLs Intravenous Contrast Given 04/03/17 0228)  LORazepam (ATIVAN) injection 1 mg (1 mg Intravenous Given 04/03/17 0506)  morphine 4 MG/ML injection 4 mg (4 mg Intravenous Given 04/03/17 0506)  cefTRIAXone (ROCEPHIN) 2 g in dextrose 5 % 50 mL IVPB (0 g Intravenous Stopped 04/03/17 0737)    And  metroNIDAZOLE (FLAGYL) IVPB 500 mg (0 mg Intravenous Stopped 04/03/17 0738)  lactated ringers bolus 1,000 mL (0 mLs Intravenous Stopped 04/03/17 0904)    Mobility walks Ambulatory, continent

## 2017-04-03 NOTE — ED Provider Notes (Signed)
[facility]-EMERGENCY DEPT Provider Note   CSN: [id number] Arrival date & time: 04/02/17  2331     History   Chief Complaint Chief Complaint  Patient presents with  . Abdominal Pain    HPI Ruben Stone is a 22 y.o. male with a hx of asthma, seizures presents to the Emergency Department complaining of gradual, persistent, progressively worsening generalized abdominal pain onset approximately 2 hours prior to arrival. Associated symptoms include persistent, bilious vomiting.  Reports he has had abdominal discomfort throughout the day and has had less oral intake.  Mother reports patient did 2 shots of liquor before he became ill.  She denies any previous abdominal surgeries for patient.  He denies known sick contacts.  Nothing seems to make his symptoms better or worse.  He denies fevers or chills, headache, neck pain, chest pain, shortness of breath, weakness, dizziness, syncope, dysuria, hematuria.   The history is provided by the patient, medical records and a parent. No language interpreter was used.    Past Medical History:  Diagnosis Date  . Asthma   . Bleeding from the nose   . Depression   . Seizures [facility])     Patient Active Problem List   Diagnosis Date Noted  . Abdominal pain 04/03/2017  . Emesis 04/03/2017  . Seizures (HCC)   . Asthma     History reviewed. No pertinent surgical history.     Home Medications    Prior to Admission medications   Medication Sig Start Date End Date Taking? Authorizing Provider  fluticasone (FLONASE) 50 MCG/ACT nasal spray Place 1 spray into both nostrils daily as needed for allergies or rhinitis.   Yes [provider]  loratadine (CLARITIN) 10 MG tablet Take 10 mg by mouth daily as needed for allergies.   Yes [provider]  clonazePAM (KLONOPIN) 0.5 MG tablet Take 0.5 tablets (0.25 mg total) by mouth at bedtime. Patient not taking: Reported on 08/28/2015 05/22/14 08/28/15  Truddie Coco, DO     Family History History reviewed. No pertinent family history.  Social History Social History   Tobacco Use  . Smoking status: Passive Smoke Exposure - Never Smoker  . Smokeless tobacco: Never Used  Substance Use Topics  . Alcohol use: No  . Drug use: No     Allergies   Patient has no known allergies.   Review of Systems Review of Systems  Constitutional: Negative for appetite change, diaphoresis, fatigue, fever and unexpected weight change.  HENT: Negative for mouth sores.   Eyes: Negative for visual disturbance.  Respiratory: Negative for cough, chest tightness, shortness of breath and wheezing.   Cardiovascular: Negative for chest pain.  Gastrointestinal: Positive for abdominal pain, nausea and vomiting. Negative for constipation and diarrhea.  Endocrine: Negative for polydipsia, polyphagia and polyuria.  Genitourinary: Negative for dysuria, frequency, hematuria and urgency.  Musculoskeletal: Negative for back pain and neck stiffness.  Skin: Negative for rash.  Allergic/Immunologic: Negative for immunocompromised state.  Neurological: Negative for syncope, light-headedness and headaches.  Hematological: Does not bruise/bleed easily.  Psychiatric/Behavioral: Negative for sleep disturbance. The patient is not nervous/anxious.      Physical Exam Updated Vital Signs BP 139/86 (BP Location: Left Arm)   Pulse 75   Temp 99.4 F (37.4 C) (Oral)   Resp 18   Ht 6' (1.829 m)   Wt 63.5 kg (140 lb)   SpO2 100%   BMI 18.99 kg/m   Physical Exam  Constitutional: He appears well-developed and well-nourished. He  appears distressed.  Awake, alert, nontoxic appearance  HENT:  Head: Normocephalic and atraumatic.  Mouth/Throat: Oropharynx is clear and moist. No oropharyngeal exudate.  Eyes: Conjunctivae are normal. No scleral icterus.  Neck: Normal range of motion. Neck supple.  Cardiovascular: Normal rate, regular rhythm and intact distal pulses.  Pulmonary/Chest:  Effort normal and breath sounds normal. No respiratory distress. He has no wheezes.  Equal chest expansion  Abdominal: Soft. Bowel sounds are normal. He exhibits no mass. There is tenderness in the right lower quadrant. There is rebound and guarding. There is no rigidity and no CVA tenderness.  Musculoskeletal: Normal range of motion. He exhibits no edema.  Neurological: He is alert.  Speech is clear and goal oriented Moves extremities without ataxia  Skin: Skin is warm and dry. He is not diaphoretic.  Psychiatric: He has a normal mood and affect.  Nursing note and vitals reviewed.    ED Treatments / Results  Labs (all labs ordered are listed, but only abnormal results are displayed) Labs Reviewed  COMPREHENSIVE METABOLIC PANEL - Abnormal; Notable for the following components:      Result Value   Potassium 3.3 (*)    Glucose, Bld 127 (*)    All other components within normal limits  CBC - Abnormal; Notable for the following components:   WBC 19.0 (*)    MCHC 36.6 (*)    RDW 11.2 (*)    All other components within normal limits  LIPASE, BLOOD  URINALYSIS, ROUTINE W REFLEX MICROSCOPIC  ETHANOL  RAPID URINE DRUG SCREEN, HOSP PERFORMED     Radiology Ct Abdomen Pelvis W Contrast  Result Date: 04/03/2017 CLINICAL DATA:  22 year old male with abdominal pain and nausea. EXAM: CT ABDOMEN AND PELVIS WITH CONTRAST TECHNIQUE: Multidetector CT imaging of the abdomen and pelvis was performed using the standard protocol following bolus administration of intravenous contrast. CONTRAST:  ISOVUE-300 IOPAMIDOL (ISOVUE-300) INJECTION 61% COMPARISON:  Abdominal CT dated 08/29/2015 FINDINGS: Lower chest: The visualized lung bases are clear. No intra-abdominal free air. There is a small free fluid within the pelvis. Hepatobiliary: No focal liver abnormality is seen. No gallstones, gallbladder wall thickening, or biliary dilatation. Pancreas: Unremarkable. No pancreatic ductal dilatation or  surrounding inflammatory changes. Spleen: Normal in size without focal abnormality. Adrenals/Urinary Tract: The adrenal glands, kidneys, and the visualized ureters appear unremarkable. There is apparent diffuse thickening of the bladder wall which may be partly related to underdistention. Cystitis is not excluded. Correlation with urinalysis recommended. Stomach/Bowel: Evaluation of the bowel is limited in the absence of oral contrast. Diffusely thickened appearance of the colon, likely related to underdistention. Colitis is less likely. Clinical correlation is recommended. There is no bowel obstruction. The appendix is suboptimally visualized. There appears to be a small amount of fluid in the visualized proximal and midportion of the appendix. The visualized appendix measure up to 9 mm. The distal portion of the appendix is poorly visualized but appear to extend is posteriorly along the right lateral pelvic sidewall. There is mild stranding of the fat surrounding the appendix which may be related to general mesenteric edema seen throughout the abdomen. An early acute appendicitis is not excluded. Clinical correlation is recommended. Vascular/Lymphatic: No significant vascular findings are present. No enlarged abdominal or pelvic lymph nodes. Reproductive: The prostate and seminal vesicles are grossly unremarkable. Other: None Musculoskeletal: No acute or significant osseous findings. IMPRESSION: Mild diffuse mesenteric edema and small amount of free fluid within the pelvis of indeterminate etiology. Apparent mild diffuse thickening of  the colon likely related to underdistention. Clinical correlation is recommended to exclude colitis. No bowel obstruction. Suboptimal evaluation of the appendix. Mild periappendiceal stranding possibly secondary to diffuse mesenteric edema. An early acute appendicitis is not entirely excluded. Clinical correlation is recommended. Electronically Signed   By: Elgie CollardArash  Radparvar M.D.    On: 04/03/2017 02:53    Procedures Procedures (including critical care time)  Medications Ordered in ED Medications  cefTRIAXone (ROCEPHIN) 2 g in dextrose 5 % 50 mL IVPB (2 g Intravenous New Bag/Given 04/03/17 0611)    And  metroNIDAZOLE (FLAGYL) IVPB 500 mg (500 mg Intravenous New Bag/Given 04/03/17 0611)  lactated ringers bolus 1,000 mL (not administered)  lactated ringers bolus 1,000 mL (not administered)  sodium chloride 0.9 % bolus 1,000 mL (0 mLs Intravenous Stopped 04/03/17 0612)  morphine 2 MG/ML injection 2 mg (2 mg Intravenous Given 04/03/17 0052)  ondansetron (ZOFRAN) injection 4 mg (4 mg Intravenous Given 04/03/17 0052)  promethazine (PHENERGAN) injection 25 mg (25 mg Intravenous Given 04/03/17 0133)  metoCLOPramide (REGLAN) injection 10 mg (10 mg Intravenous Given 04/03/17 0317)  iopamidol (ISOVUE-300) 61 % injection 100 mL (100 mLs Intravenous Contrast Given 04/03/17 0228)  LORazepam (ATIVAN) injection 1 mg (1 mg Intravenous Given 04/03/17 0506)  morphine 4 MG/ML injection 4 mg (4 mg Intravenous Given 04/03/17 0506)     Initial Impression / Assessment and Plan / ED Course  I have reviewed the triage vital signs and the nursing notes.  Pertinent labs & imaging results that were available during my care of the patient were reviewed by me and considered in my medical decision making (see chart for details).  Clinical Course as of Apr 03 622  Sat Apr 03, 2017  0147 Patient continues to vomit despite Zofran and Phenergan administration.  Initial emesis was bilious.  Now emesis is brown.  [HM]  [id number] Discussed with Dr. Michaell CowingGross who will evaluate  [HM]  0623 Discussed with Dr. Katrinka BlazingSmith who will admit for intractable vomiting and colitis  [HM]    Clinical Course User Index [HM] Annjeanette Sarwar, Dahlia ClientHannah, PA-C    Patient presents with severe abdominal pain and vomiting.  Bilious emesis here in the emergency department.  On initial exam patient is so uncomfortable that abdominal exam is  difficult.  He seems to be generally tender.  Patient given fluids, antiemetics and pain control.  On repeat exam patient with focal right lower quadrant abdominal pain with rebound and guarding.  CT scan with colitis versus appendicitis as it is hard to visualize the appendix.  Patient has had persistent vomiting despite multiple types of antiemetics including Zofran, Phenergan, Reglan and Ativan.  He continues to writhe in the bed.  Despite inadequate visualization of appendix I am concerned for possible appendicitis.  Will consult with general surgery.  6:24 AM Pt evaluated by Dr. Michaell CowingGross who does not feel this is surgical.  Will admit for intractable vomiting and continued pain.    Final Clinical Impressions(s) / ED Diagnoses   Final diagnoses:  Abdominal pain, unspecified abdominal location  Intractable cyclical vomiting with nausea    ED Discharge Orders    None       Mardene SayerMuthersbaugh, Boyd KerbsHannah, PA-C 04/03/17 [id number]    Dione BoozeGlick, David, MD 04/03/17 [phone]

## 2017-04-04 DIAGNOSIS — R1084 Generalized abdominal pain: Secondary | ICD-10-CM

## 2017-04-04 LAB — CBC
HCT: 39.7 % (ref 39.0–52.0)
Hemoglobin: 13.9 g/dL (ref 13.0–17.0)
MCH: 30.1 pg (ref 26.0–34.0)
MCHC: 35 g/dL (ref 30.0–36.0)
MCV: 85.9 fL (ref 78.0–100.0)
PLATELETS: 156 10*3/uL (ref 150–400)
RBC: 4.62 MIL/uL (ref 4.22–5.81)
RDW: 11.4 % — AB (ref 11.5–15.5)
WBC: 9.8 10*3/uL (ref 4.0–10.5)

## 2017-04-04 LAB — BASIC METABOLIC PANEL
Anion gap: 5 (ref 5–15)
BUN: 9 mg/dL (ref 6–20)
CALCIUM: 8.8 mg/dL — AB (ref 8.9–10.3)
CO2: 26 mmol/L (ref 22–32)
CREATININE: 1.15 mg/dL (ref 0.61–1.24)
Chloride: 108 mmol/L (ref 101–111)
GFR calc non Af Amer: >60 mL/min (ref >60)
Glucose, Bld: 75 mg/dL (ref 65–99)
Potassium: 3.3 mmol/L — ABNORMAL LOW (ref 3.5–5.1)
Sodium: 139 mmol/L (ref 135–145)

## 2017-04-04 LAB — HIV ANTIBODY (ROUTINE TESTING W REFLEX): HIV Screen 4th Generation wRfx: NONREACTIVE

## 2017-04-04 MED ORDER — METRONIDAZOLE 500 MG PO TABS: 500.0000 mg | ORAL_TABLET | Freq: Three times a day (TID) | ORAL | 0 refills | Status: AC

## 2017-04-04 MED ORDER — LEVOFLOXACIN 750 MG PO TABS: 750.0000 mg | ORAL_TABLET | Freq: Every day | ORAL | 0 refills | Status: DC

## 2017-04-04 MED ORDER — POTASSIUM CHLORIDE CRYS ER 20 MEQ PO TBCR
40.0000 meq | EXTENDED_RELEASE_TABLET | Freq: Once | ORAL | Status: AC
  Administered 2017-04-04: 11:00:00 40 meq via ORAL
  Filled 2017-04-04: qty 2

## 2017-04-04 NOTE — Progress Notes (Signed)
Discharge instructions and prescriptions reviewed with patient utilizing teach back method.  Patient being discharged to home. 

## 2017-04-04 NOTE — Care Management Note (Signed)
Case Management Note  Patient Details  Name: Ruben SchultzJamel R Stone MRN: [id number] Date of Birth: 10-07-1995  Subjective/Objective:   colitis                 Action/Plan: Discharge Planning: Spoke to pt and states he has Medicaid. Does not have copy of card. Contacted ED Registration to attempt to have them look up on [employer]. Explained to pt that he can use his Medicaid card to get meds for $3.  [facility]. States he does not know the name of his PCP.   Expected Discharge Date:  04/04/17               Expected Discharge Plan:  Home/Self Care  In-House Referral:  NA  Discharge planning Services  CM Consult  Post Acute Care Choice:  NA Choice offered to:  NA  DME Arranged:  N/A DME Agency:  NA  HH Arranged:  NA HH Agency:  NA  Status of Service:  Completed, signed off  If discussed at Long Length of Stay Meetings, dates discussed:    Additional Comments:  Elliot CousinShavis, Bekka Qian Ellen, RN 04/04/2017, 9:56 AM

## 2017-04-04 NOTE — Discharge Instructions (Signed)
Colitis Colitis is inflammation of the colon. Colitis may last a short time (acute) or it may last a long time (chronic). What are the causes? This condition may be caused by:  Viruses.  Bacteria.  Reactions to medicine.  Certain autoimmune diseases, such as Crohn disease or ulcerative colitis.  What are the signs or symptoms? Symptoms of this condition include:  Diarrhea.  Passing bloody or tarry stool.  Pain.  Fever.  Vomiting.  Tiredness (fatigue).  Weight loss.  Bloating.  Sudden increase in abdominal pain.  Having fewer bowel movements than usual.  How is this diagnosed? This condition is diagnosed with a stool test or a blood test. You may also have other tests, including X-rays, a CT scan, or a colonoscopy. How is this treated? Treatment may include:  Resting the bowel. This involves not eating or drinking for a period of time.  Fluids that are given through an IV tube.  Medicine for pain and diarrhea.  Antibiotic medicines.  Cortisone medicines.  Surgery.  Follow these instructions at home: Eating and drinking  Follow instructions from your health care provider about eating or drinking restrictions.  Drink enough fluid to keep your urine clear or pale yellow.  Work with a dietitian to determine which foods cause your condition to flare up.  Avoid foods that cause flare-ups.  Eat a well-balanced diet. Medicines  Take over-the-counter and prescription medicines only as told by your health care provider.  If you were prescribed an antibiotic medicine, take it as told by your health care provider. Do not stop taking the antibiotic even if you start to feel better. General instructions  Keep all follow-up visits as told by your health care provider. This is important. Contact a health care provider if:  Your symptoms do not go away.  You develop new symptoms. Get help right away if:  You have a fever that does not go away with  treatment.  You develop chills.  You have extreme weakness, fainting, or dehydration.  You have repeated vomiting.  You develop severe pain in your abdomen.  You pass bloody or tarry stool. This information is not intended to replace advice given to you by your health care provider. Make sure you discuss any questions you have with your health care provider. Document Released: 04/09/2004 Document Revised: 08/08/2015 Document Reviewed: 06/25/2014 Elsevier Interactive Patient Education  2018 Elsevier Inc.  

## 2017-04-04 NOTE — Discharge Summary (Signed)
Physician Discharge Summary  Ruben SchultzJamel R Stone [id number] DOB: [birth year] DOA: 04/03/2017  PCP: Patient, No Pcp Per  Admit date: 04/03/2017 Discharge date: 04/04/2017  Recommendations for Outpatient Follow-up:  Continue Levaquin and flagyl for 6 days on discharge Follow up with PCP in 1-2 weeks on discharge  Discharge Diagnoses:  Principal Problem:   Abdominal pain Active Problems:   Emesis   Altered mental status, unspecified   History of seizures    Discharge Condition: stable   Diet recommendation: as tolerated   History of present illness:  Per HPI  "22 y.o. male with past medical history relevant for depression, seizure disorder tobacco and THC use who presents to the ED with sudden onset of abdominal pain couple of hours prior to arrival in the ED. abdominal pain started after patient drank 2 shots of liquor.  She denies excessive alcohol use on a regular basis patient had few episodes of bilious emesis, no blood.  Denies diarrhea, no sick contacts at home, no fevers or chills In ED... Patient is found to have a white count of 19,000, CT abdomen and pelvis with concerns about possible colitis and inability to rule out acute appendicitis, surgical consult obtained by ED provider Dr. Michaell CowingGross from general surgery believes there is little likelihood of acute appendicitis .CT Abd/Pelvis shows- Mild diffuse mesenteric edema and small amount of free fluid within the pelvis of indeterminate etiology. Apparent mild diffuse thickening of the colon likely related to underdistention. Clinical correlation is recommended to exclude colitis. No bowel obstruction. Suboptimal evaluation of the appendix."   Hospital Course:   Principal Problem:   Abdominal pain / Colitis / Leukocytosis / Nausea and vomiting in adult  - Feels much better this am - Tolerates liquids and wants to try solids - He will continue Levaquin and flagyl on discharge for 6 more days and follow up with  PCP   Signed:  Manson PasseyAlma Makenleigh Crownover, MD  [facility] 04/04/2017, 9:34 AM  Pager #: [phone]  Time spent in minutes: less than 30 minutes  Procedures:  None   Consultations:  None   Discharge Exam: Vitals:   04/03/17 2104 04/04/17 0553  BP: 103/73 110/70  Pulse: 71 71  Resp: 16 16  Temp: 99 F (37.2 C) 99.2 F (37.3 C)  SpO2: 100% 100%   Vitals:   04/03/17 0955 04/03/17 1309 04/03/17 2104 04/04/17 0553  BP: 117/64 118/72 103/73 110/70  Pulse: 75 72 71 71  Resp: 14 16 16 16   Temp:   99 F (37.2 C) 99.2 F (37.3 C)  TempSrc:   Oral Oral  SpO2: 100% 100% 100% 100%  Weight:      Height:        General: Pt is alert, follows commands appropriately, not in acute distress Cardiovascular: Regular rate and rhythm, S1/S2 +, no murmurs Respiratory: Clear to auscultation bilaterally, no wheezing, no crackles, no rhonchi Abdominal: Soft, non tender, non distended, bowel sounds +, no guarding Extremities: no edema, no cyanosis, pulses palpable bilaterally DP and PT Neuro: Grossly nonfocal  Discharge Instructions  Discharge Instructions    Call MD for:  persistant nausea and vomiting   Complete by:  As directed    Call MD for:  redness, tenderness, or signs of infection (pain, swelling, redness, odor or green/yellow discharge around incision site)   Complete by:  As directed    Call MD for:  severe uncontrolled pain   Complete by:  As directed    Diet - low sodium heart healthy  Complete by:  As directed    Diet general   Complete by:  As directed    Discharge instructions   Complete by:  As directed    Continue Levaquin and flagyl for 6 days on discharge Follow up with PCP in 1-2 weeks on discharge   Increase activity slowly   Complete by:  As directed      Allergies as of 04/04/2017   No Known Allergies     Medication List    STOP taking these medications   clonazePAM 0.5 MG tablet Commonly known as:  KLONOPIN     TAKE these medications    fluticasone 50 MCG/ACT nasal spray Commonly known as:  FLONASE Place 1 spray into both nostrils daily as needed for allergies or rhinitis.   levofloxacin 750 MG tablet Commonly known as:  LEVAQUIN Take 1 tablet (750 mg total) by mouth daily.   loratadine 10 MG tablet Commonly known as:  CLARITIN Take 10 mg by mouth daily as needed for allergies.   metroNIDAZOLE 500 MG tablet Commonly known as:  FLAGYL Take 1 tablet (500 mg total) by mouth 3 (three) times daily for 6 days.         The results of significant diagnostics from this hospitalization (including imaging, microbiology, ancillary and laboratory) are listed below for reference.    Significant Diagnostic Studies: Ct Abdomen Pelvis W Contrast  Result Date: 04/03/2017 CLINICAL DATA:  22 year old male with abdominal pain and nausea. EXAM: CT ABDOMEN AND PELVIS WITH CONTRAST TECHNIQUE: Multidetector CT imaging of the abdomen and pelvis was performed using the standard protocol following bolus administration of intravenous contrast. CONTRAST:  ISOVUE-300 IOPAMIDOL (ISOVUE-300) INJECTION 61% COMPARISON:  Abdominal CT dated 08/29/2015 FINDINGS: Lower chest: The visualized lung bases are clear. No intra-abdominal free air. There is a small free fluid within the pelvis. Hepatobiliary: No focal liver abnormality is seen. No gallstones, gallbladder wall thickening, or biliary dilatation. Pancreas: Unremarkable. No pancreatic ductal dilatation or surrounding inflammatory changes. Spleen: Normal in size without focal abnormality. Adrenals/Urinary Tract: The adrenal glands, kidneys, and the visualized ureters appear unremarkable. There is apparent diffuse thickening of the bladder wall which may be partly related to underdistention. Cystitis is not excluded. Correlation with urinalysis recommended. Stomach/Bowel: Evaluation of the bowel is limited in the absence of oral contrast. Diffusely thickened appearance of the colon, likely related to  underdistention. Colitis is less likely. Clinical correlation is recommended. There is no bowel obstruction. The appendix is suboptimally visualized. There appears to be a small amount of fluid in the visualized proximal and midportion of the appendix. The visualized appendix measure up to 9 mm. The distal portion of the appendix is poorly visualized but appear to extend is posteriorly along the right lateral pelvic sidewall. There is mild stranding of the fat surrounding the appendix which may be related to general mesenteric edema seen throughout the abdomen. An early acute appendicitis is not excluded. Clinical correlation is recommended. Vascular/Lymphatic: No significant vascular findings are present. No enlarged abdominal or pelvic lymph nodes. Reproductive: The prostate and seminal vesicles are grossly unremarkable. Other: None Musculoskeletal: No acute or significant osseous findings. IMPRESSION: Mild diffuse mesenteric edema and small amount of free fluid within the pelvis of indeterminate etiology. Apparent mild diffuse thickening of the colon likely related to underdistention. Clinical correlation is recommended to exclude colitis. No bowel obstruction. Suboptimal evaluation of the appendix. Mild periappendiceal stranding possibly secondary to diffuse mesenteric edema. An early acute appendicitis is not entirely excluded. Clinical correlation  is recommended. Electronically Signed   By: Elgie Collard M.D.   On: 04/03/2017 02:53    Microbiology: No results found for this or any previous visit (from the past 240 hour(s)).   Labs: Basic Metabolic Panel: Recent Labs  Lab 04/03/17 0046 04/04/17 0557  NA 137 139  K 3.3* 3.3*  CL 103 108  CO2 22 26  GLUCOSE 127* 75  BUN 13 9  CREATININE 1.11 1.15  CALCIUM 10.0 8.8*   Liver Function Tests: Recent Labs  Lab 04/03/17 0046  AST 38  ALT 22  ALKPHOS 58  BILITOT 1.2  PROT 7.9  ALBUMIN 5.0   Recent Labs  Lab 04/03/17 0046  LIPASE 28    No results for input(s): AMMONIA in the last 168 hours. CBC: Recent Labs  Lab 04/03/17 0046 04/04/17 0557  WBC 19.0* 9.8  HGB 15.8 13.9  HCT 43.2 39.7  MCV 83.7 85.9  PLT 195 156   Cardiac Enzymes: No results for input(s): CKTOTAL, CKMB, CKMBINDEX, TROPONINI in the last 168 hours. BNP: BNP (last 3 results) No results for input(s): BNP in the last 8760 hours.  ProBNP (last 3 results) No results for input(s): PROBNP in the last 8760 hours.  CBG: No results for input(s): GLUCAP in the last 168 hours.

## 2017-04-08 LAB — CULTURE, BLOOD (ROUTINE X 2)
CULTURE: NO GROWTH
Culture: NO GROWTH
SPECIAL REQUESTS: ADEQUATE
Special Requests: ADEQUATE

## 2018-04-04 IMAGING — CT CT ABD-PELV W/ CM
2 of 4 series · 14 of 46 positions shown, 16 images · IV contrast (ISOVUE)
Comparison: Abdominal CT dated 08/29/2015

CLINICAL DATA: 21-year-old male with abdominal pain and nausea.

EXAM:
CT ABDOMEN AND PELVIS WITH CONTRAST
TECHNIQUE: Multidetector CT imaging of the abdomen and pelvis was performed
using the standard protocol following bolus administration of
intravenous contrast.
CONTRAST:  100mL LKPV7P-7YY IOPAMIDOL (LKPV7P-7YY) INJECTION 61%

[Series 2: axial st · axial · 0.65mm/px · z∈[-565,-145]mm · 11 of 98 slices shown, 13 images]
[im 7/98  soft-tissue]
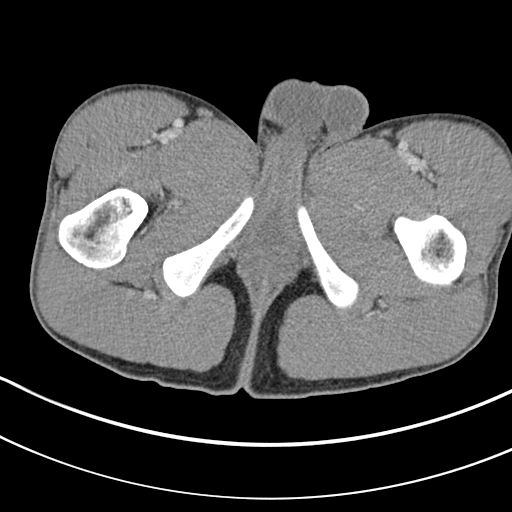
[im 7/98  bone]
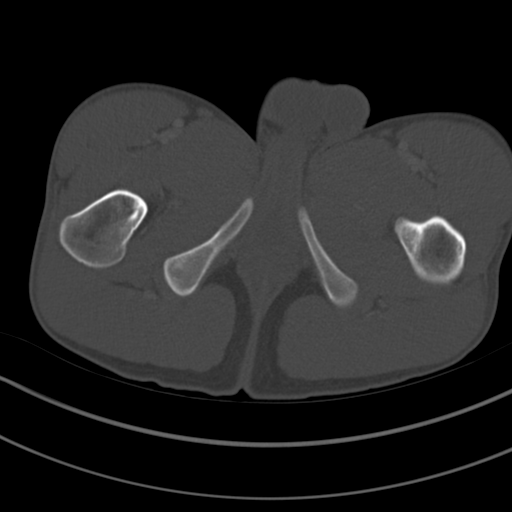
[im 19/98  soft-tissue]
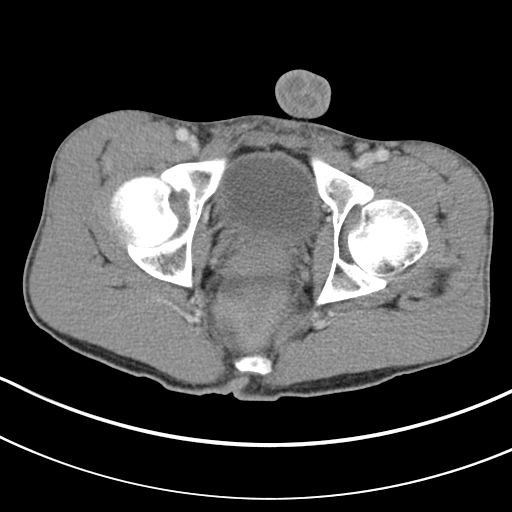
[im 25/98  soft-tissue]
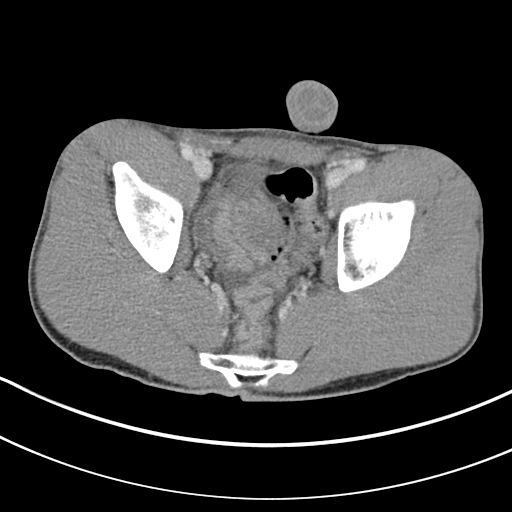
[im 31/98  soft-tissue]
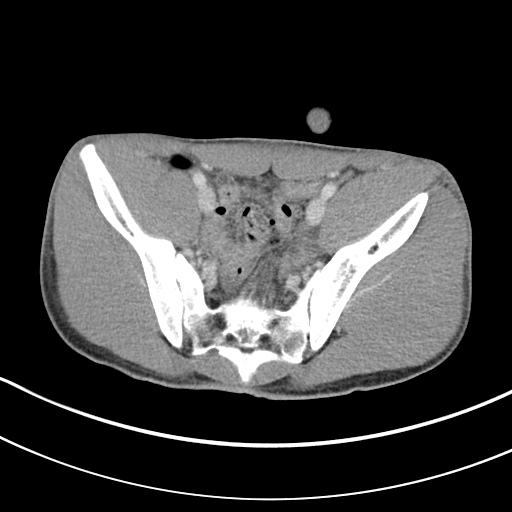
[im 43/98  soft-tissue]
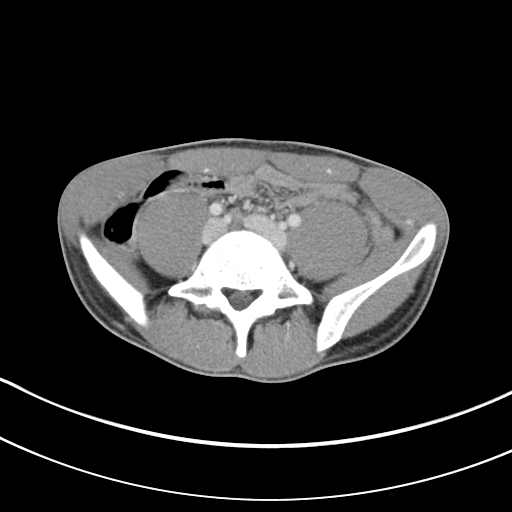
[im 49/98  soft-tissue]
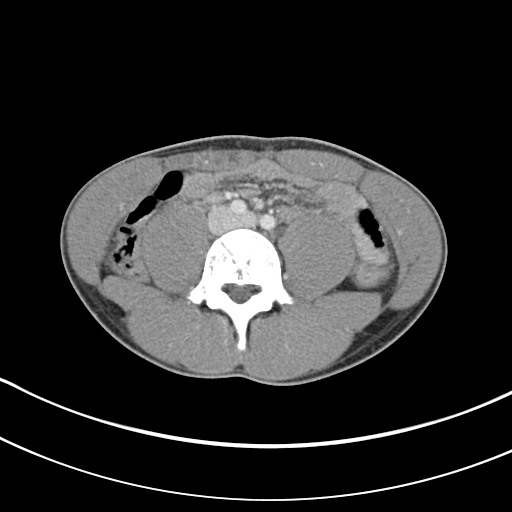
[im 55/98  soft-tissue]
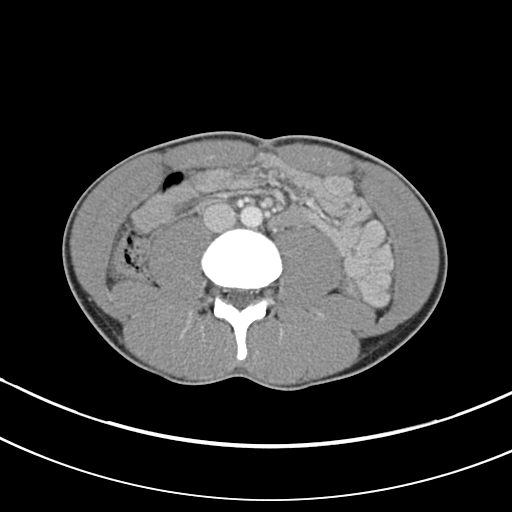
[im 67/98  soft-tissue]
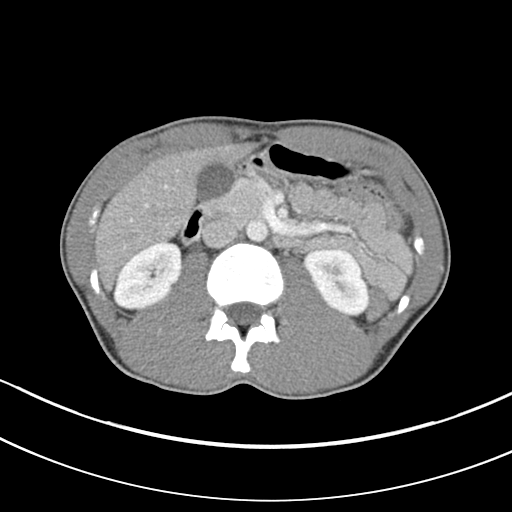
[im 73/98  soft-tissue]
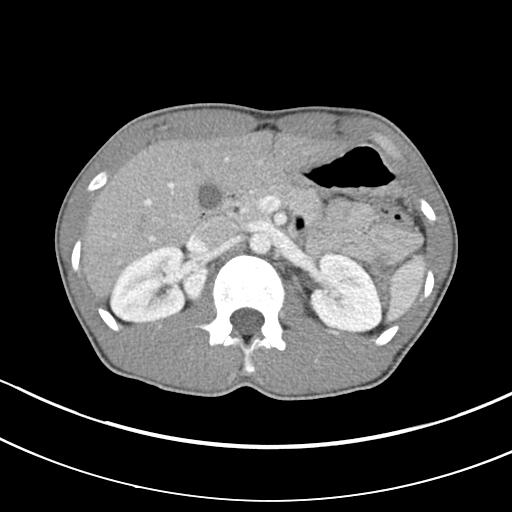
[im 73/98  bone]
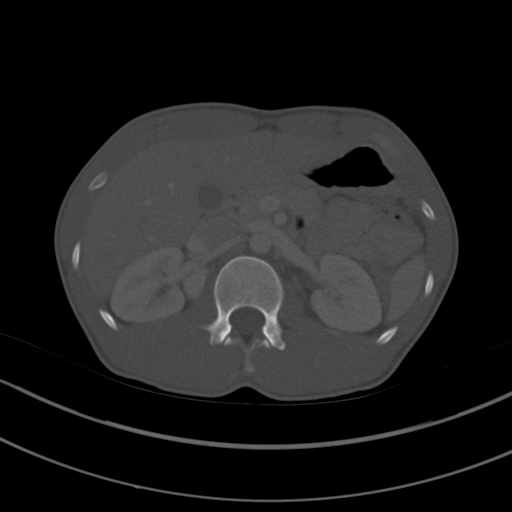
[im 79/98  soft-tissue]
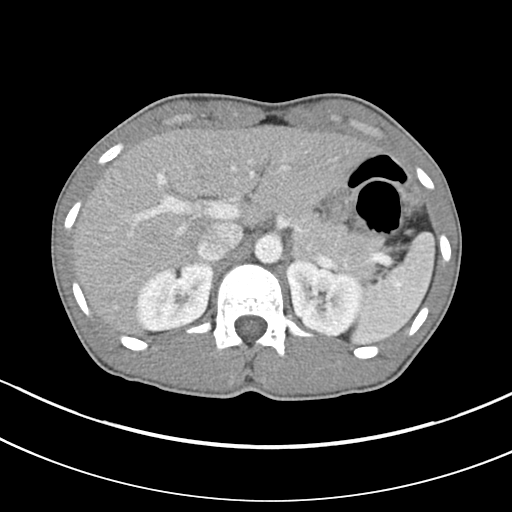
[im 91/98  soft-tissue]
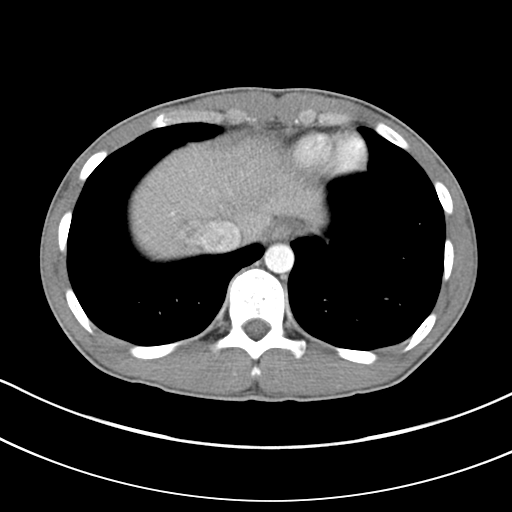

[Series 5: coronal st · coronal · 0.62mm/px · 3 of 67 slices shown]
[im 23/67  soft-tissue]
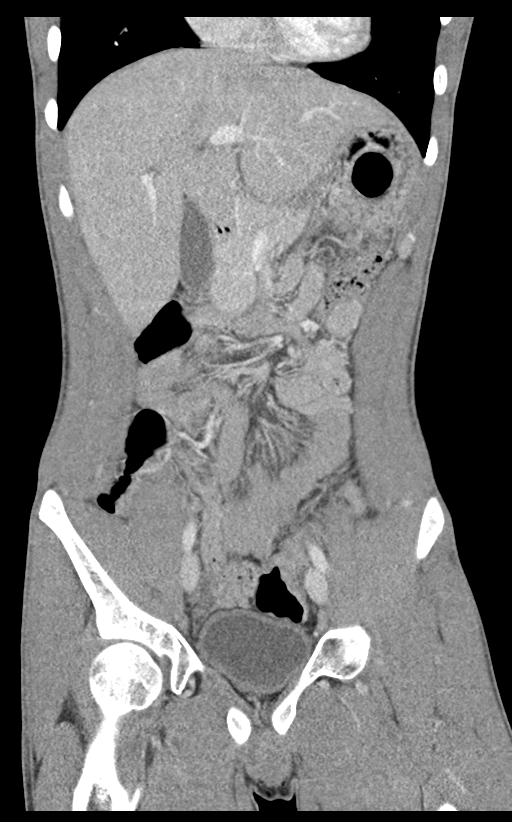
[im 30/67  soft-tissue]
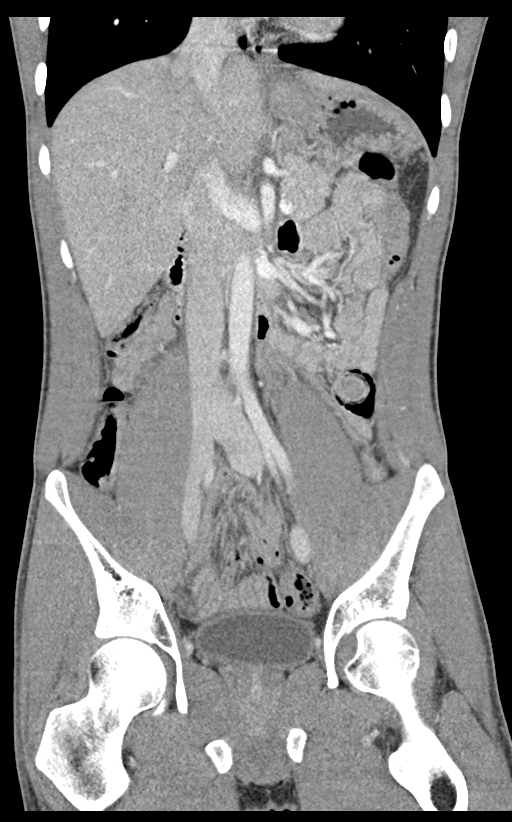
[im 37/67  soft-tissue]
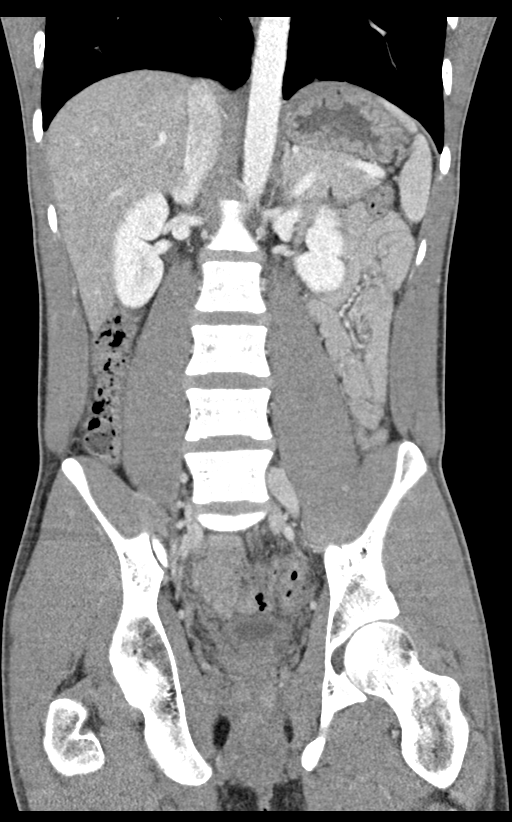

[14 of 46 positions shown; findings below may reference images not displayed]

FINDINGS: Lower chest: The visualized lung bases are clear.

No intra-abdominal free air. There is a small free fluid within the
pelvis.

Hepatobiliary: No focal liver abnormality is seen. No gallstones,
gallbladder wall thickening, or biliary dilatation.

Pancreas: Unremarkable. No pancreatic ductal dilatation or
surrounding inflammatory changes.

Spleen: Normal in size without focal abnormality.

Adrenals/Urinary Tract: The adrenal glands, kidneys, and the
visualized ureters appear unremarkable. There is apparent diffuse
thickening of the bladder wall which may be partly related to
underdistention. Cystitis is not excluded. Correlation with
urinalysis recommended.

Stomach/Bowel: Evaluation of the bowel is limited in the absence of
oral contrast. Diffusely thickened appearance of the colon, likely
related to underdistention. Colitis is less likely. Clinical
correlation is recommended. There is no bowel obstruction. The
appendix is suboptimally visualized. There appears to be a small
amount of fluid in the visualized proximal and midportion of the
appendix. The visualized appendix measure up to 9 mm. The distal
portion of the appendix is poorly visualized but appear to extend is
posteriorly along the right lateral pelvic sidewall. There is mild
stranding of the fat surrounding the appendix which may be related
to general mesenteric edema seen throughout the abdomen. An early
acute appendicitis is not excluded. Clinical correlation is
recommended.

Vascular/Lymphatic: No significant vascular findings are present. No
enlarged abdominal or pelvic lymph nodes.

Reproductive: The prostate and seminal vesicles are grossly
unremarkable.

Other: None

Musculoskeletal: No acute or significant osseous findings.
IMPRESSION: Mild diffuse mesenteric edema and small amount of free fluid within
the pelvis of indeterminate etiology. Apparent mild diffuse
thickening of the colon likely related to underdistention. Clinical
correlation is recommended to exclude colitis. No bowel obstruction.
Suboptimal evaluation of the appendix. Mild periappendiceal
stranding possibly secondary to diffuse mesenteric edema. An early
acute appendicitis is not entirely excluded. Clinical correlation is
recommended.

## 2021-09-10 ENCOUNTER — Ambulatory Visit
Admission: EM | Admit: 2021-09-10 | Discharge: 2021-09-10 | Disposition: A | Discharge status: Home / Self Care | Payer: Medicaid Other | Attending: Family Medicine | Admitting: Family Medicine

## 2021-09-10 DIAGNOSIS — J4521 Mild intermittent asthma with (acute) exacerbation: Secondary | ICD-10-CM

## 2021-09-10 DIAGNOSIS — J069 Acute upper respiratory infection, unspecified: Secondary | ICD-10-CM

## 2021-09-10 MED ORDER — PREDNISONE 20 MG PO TABS: 40.0000 mg | ORAL_TABLET | Freq: Every day | ORAL | 0 refills | Status: AC

## 2021-09-10 MED ORDER — ALBUTEROL SULFATE HFA 108 (90 BASE) MCG/ACT IN AERS: 1.0000 | INHALATION_SPRAY | RESPIRATORY_TRACT | 0 refills | Status: AC | PRN

## 2021-09-10 MED ORDER — ACETAMINOPHEN 325 MG PO TABS
650.0000 mg | ORAL_TABLET | Freq: Once | ORAL | Status: AC
  Administered 2021-09-10: 650 mg via ORAL

## 2021-09-10 NOTE — Discharge Instructions (Addendum)
  You have been swabbed for COVID, and the test will result in the next 24 hours. Our staff will call you if positive. If the test is positive, you should quarantine for 5 days.  Albuterol inhaler--do 2 puffs every 4 hours as needed for shortness of breath or wheezing  Prednisone 20 mg--was 2 daily for 5 days.  This is for inflammation in your lungs  Tylenol or ibuprofen as needed for pain or fever.  Rest and drink plenty of fluids

## 2021-09-10 NOTE — ED Provider Notes (Addendum)
[facility]    CSN: [id number] Arrival date & time: 09/10/21  1500      History   Chief Complaint Chief Complaint  Patient presents with   Fever   Cough   Shortness of Breath    HPI Ruben Stone is a 26 y.o. male.    Fever Associated symptoms: cough   Cough Associated symptoms: fever and shortness of breath   Shortness of Breath Associated symptoms: cough and fever    Here with fever, chills, malaise, and wheezing that began last night.  He has had rhinorrhea and nasal congestion and cough also.  No vomiting or diarrhea.  He does have a history of asthma, but has not used an inhaler in years.    Past Medical History:  Diagnosis Date   Asthma    Bleeding from the nose    Depression    Seizures [facility])     Patient Active Problem List   Diagnosis Date Noted   Abdominal pain 04/03/2017   Emesis 04/03/2017   Altered mental status, unspecified 04/03/2017   History of seizures 04/03/2017   Seizures (HCC)    Asthma     No past surgical history on file.     Home Medications    Prior to Admission medications   Medication Sig Start Date End Date Taking? Authorizing Provider  albuterol (VENTOLIN HFA) 108 (90 Base) MCG/ACT inhaler Inhale 1-2 puffs into the lungs every 4 (four) hours as needed for wheezing or shortness of breath. 09/10/21  Yes Finnlee Silvernail, Janace Aris, MD  predniSONE (DELTASONE) 20 MG tablet Take 2 tablets (40 mg total) by mouth daily with breakfast for 5 days. 09/10/21 09/15/21 Yes Jenipher Havel, Janace Aris, MD  fluticasone (FLONASE) 50 MCG/ACT nasal spray Place 1 spray into both nostrils daily as needed for allergies or rhinitis.    [provider]  loratadine (CLARITIN) 10 MG tablet Take 10 mg by mouth daily as needed for allergies.    [provider]    Family History No family history on file.  Social History Social History   Tobacco Use   Smoking status: Some Days    Types: Cigarettes    Passive exposure: Yes    Smokeless tobacco: Never  Substance Use Topics   Alcohol use: No   Drug use: No     Allergies   Patient has no known allergies.   Review of Systems Review of Systems  Constitutional:  Positive for fever.  Respiratory:  Positive for cough and shortness of breath.      Physical Exam Triage Vital Signs ED Triage Vitals [09/10/21 1525]  Enc Vitals Group     BP 130/62     Pulse Rate 83     Resp      Temp (!) 100.5 F (38.1 C)     Temp Source Oral     SpO2 96 %     Weight      Height      Head Circumference      Peak Flow      Pain Score      Pain Loc      Pain Edu?      Excl. in GC?    No data found.  Updated Vital Signs BP 130/62 (BP Location: Right Arm)   Pulse 83   Temp (!) 100.5 F (38.1 C) (Oral)   SpO2 96%   Visual Acuity Right Eye Distance:   Left Eye Distance:   Bilateral Distance:  Right Eye Near:   Left Eye Near:    Bilateral Near:     Physical Exam Vitals reviewed.  Constitutional:      General: He is not in acute distress.    Appearance: He is not toxic-appearing.  HENT:     Right Ear: Tympanic membrane and ear canal normal.     Left Ear: Tympanic membrane and ear canal normal.     Nose: Nose normal.     Mouth/Throat:     Mouth: Mucous membranes are moist.     Pharynx: No posterior oropharyngeal erythema.     Comments: There is no erythema in the oropharynx but there is some clear mucus draining Eyes:     Extraocular Movements: Extraocular movements intact.     Conjunctiva/sclera: Conjunctivae normal.     Pupils: Pupils are equal, round, and reactive to light.  Cardiovascular:     Rate and Rhythm: Normal rate and regular rhythm.     Heart sounds: No murmur heard. Pulmonary:     Effort: Pulmonary effort is normal.     Breath sounds: No stridor. Wheezing (bilaterally, with good air movement) present. No rhonchi.  Musculoskeletal:     Cervical back: Neck supple.  Lymphadenopathy:     Cervical: No cervical adenopathy.  Skin:     Capillary Refill: Capillary refill takes less than 2 seconds.     Coloration: Skin is not jaundiced or pale.  Neurological:     General: No focal deficit present.     Mental Status: He is alert and oriented to person, place, and time.  Psychiatric:        Behavior: Behavior normal.      UC Treatments / Results  Labs (all labs ordered are listed, but only abnormal results are displayed) Labs Reviewed  COVID-19, FLU A+B NAA    EKG   Radiology No results found.  Procedures Procedures (including critical care time)  Medications Ordered in UC Medications  acetaminophen (TYLENOL) tablet 650 mg (650 mg Oral Given 09/10/21 1543)    Initial Impression / Assessment and Plan / UC Course  I have reviewed the triage vital signs and the nursing notes.  Pertinent labs & imaging results that were available during my care of the patient were reviewed by me and considered in my medical decision making (see chart for details).     We will swab for flu and COVID.  If positive for flu he should be a candidate for Tamiflu since his symptoms started in the last 24 hours.  If he is positive for COVID, he should be a candidate for Paxlovid to prevent him from worsening.  His renal function was normal when last done in epic  I will treat for asthma exacerbation Final Clinical Impressions(s) / UC Diagnoses   Final diagnoses:  Viral URI with cough  Mild intermittent asthma with acute exacerbation     Discharge Instructions       You have been swabbed for COVID, and the test will result in the next 24 hours. Our staff will call you if positive. If the test is positive, you should quarantine for 5 days.  Albuterol inhaler--do 2 puffs every 4 hours as needed for shortness of breath or wheezing  Prednisone 20 mg--was 2 daily for 5 days.  This is for inflammation in your lungs  Tylenol or ibuprofen as needed for pain or fever.  Rest and drink plenty of fluids     ED Prescriptions      Medication  Sig Dispense Auth. Provider   albuterol (VENTOLIN HFA) 108 (90 Base) MCG/ACT inhaler Inhale 1-2 puffs into the lungs every 4 (four) hours as needed for wheezing or shortness of breath. 1 each Zenia Resides, MD   predniSONE (DELTASONE) 20 MG tablet Take 2 tablets (40 mg total) by mouth daily with breakfast for 5 days. 10 tablet Marlinda Mike Janace Aris, MD      PDMP not reviewed this encounter.   Zenia Resides, MD 09/10/21 1552    Zenia Resides, MD 09/10/21 [phone]

## 2021-09-10 NOTE — ED Triage Notes (Signed)
Fever, Chill SOB, constrictive in face Congesting , productive cough , sneezing, nasal drainage started at work today. -30 degree work environment. Wheezing at night

## 2021-09-11 LAB — COVID-19, FLU A+B NAA
Influenza A, NAA: NOT DETECTED
Influenza B, NAA: NOT DETECTED
SARS-CoV-2, NAA: NOT DETECTED

## 2021-11-19 ENCOUNTER — Ambulatory Visit
Admission: EM | Admit: 2021-11-19 | Discharge: 2021-11-19 | Disposition: A | Discharge status: Home / Self Care | Payer: No Typology Code available for payment source | Attending: Urgent Care | Admitting: Urgent Care

## 2021-11-19 DIAGNOSIS — K0889 Other specified disorders of teeth and supporting structures: Secondary | ICD-10-CM | POA: Diagnosis not present

## 2021-11-19 MED ORDER — KETOROLAC TROMETHAMINE 60 MG/2ML IM SOLN
60.0000 mg | Freq: Once | INTRAMUSCULAR | Status: AC
  Administered 2021-11-19: 60 mg via INTRAMUSCULAR

## 2021-11-19 NOTE — ED Triage Notes (Signed)
Scheduled for a Root Canal tomorrow but pain to intense to go to work today. Left Upper tooth. Patient Requesting something for the pain and a note for missing work today.

## 2021-11-19 NOTE — ED Notes (Signed)
Scheduled for a Root Canal tomorrow but pain to intense to go to work today. Left Upper tooth. Patient Requesting something for the pain and a note for missing work today.  Pain 10/10

## 2021-11-19 NOTE — ED Provider Notes (Signed)
  [facility]  Note:  This document was prepared using Conservation officer, historic buildings and may include unintentional dictation errors.  MRN: [id number] DOB: 1995-08-17  Subjective:   Ruben Stone is a 26 y.o. male presenting for pain management for his dental pain.  Patient has had persistent left-sided upper molar pain.  He has an appointment with dental specialist tomorrow to get the process started of starting a root canal.  Needs a note for work as well.  No current facility-administered medications for this encounter.  Current Outpatient Medications:    albuterol (VENTOLIN HFA) 108 (90 Base) MCG/ACT inhaler, Inhale 1-2 puffs into the lungs every 4 (four) hours as needed for wheezing or shortness of breath., Disp: 1 each, Rfl: 0   No Known Allergies  Past Medical History:  Diagnosis Date   Asthma    Bleeding from the nose    Depression    Seizures (HCC)      History reviewed. No pertinent surgical history.  History reviewed. No pertinent family history.  Social History   Tobacco Use   Smoking status: Some Days    Types: Cigarettes    Passive exposure: Yes   Smokeless tobacco: Never  Substance Use Topics   Alcohol use: No   Drug use: No    ROS   Objective:   Vitals: BP 121/73 (BP Location: Right Arm)   Pulse 70   Temp 97.9 F (36.6 C) (Oral)   SpO2 99%   Physical Exam Constitutional:      General: He is not in acute distress.    Appearance: Normal appearance. He is well-developed and normal weight. He is not ill-appearing, toxic-appearing or diaphoretic.  HENT:     Head: Normocephalic and atraumatic.     Right Ear: External ear normal.     Left Ear: External ear normal.     Nose: Nose normal.     Mouth/Throat:     Pharynx: Oropharynx is clear.   Eyes:     General: No scleral icterus.       Right eye: No discharge.        Left eye: No discharge.     Extraocular Movements: Extraocular movements intact.  Cardiovascular:      Rate and Rhythm: Normal rate.  Pulmonary:     Effort: Pulmonary effort is normal.  Musculoskeletal:     Cervical back: Normal range of motion.  Neurological:     Mental Status: He is alert and oriented to person, place, and time.  Psychiatric:        Mood and Affect: Mood normal.        Behavior: Behavior normal.        Thought Content: Thought content normal.        Judgment: Judgment normal.    IM Toradol in clinic at 60 mg.  Assessment and Plan :   PDMP not reviewed this encounter.  1. Pain, dental    Provided pain relief for today to purchase appointment tomorrow with his dentist. Counseled patient on potential for adverse effects with medications prescribed/recommended today, ER and return-to-clinic precautions discussed, patient verbalized understanding.    Wallis Bamberg, PA-C 11/19/21 1910

## 2022-03-09 ENCOUNTER — Other Ambulatory Visit: Payer: Self-pay

## 2022-03-09 ENCOUNTER — Encounter (HOSPITAL_COMMUNITY): Payer: Self-pay

## 2022-03-09 ENCOUNTER — Emergency Department (HOSPITAL_COMMUNITY)
Admission: EM | Admit: 2022-03-09 | Discharge: 2022-03-09 | Disposition: A | Discharge status: Home / Self Care | Payer: No Typology Code available for payment source | Attending: Emergency Medicine | Admitting: Emergency Medicine

## 2022-03-09 DIAGNOSIS — Z7951 Long term (current) use of inhaled steroids: Secondary | ICD-10-CM | POA: Insufficient documentation

## 2022-03-09 DIAGNOSIS — J45909 Unspecified asthma, uncomplicated: Secondary | ICD-10-CM | POA: Insufficient documentation

## 2022-03-09 DIAGNOSIS — R531 Weakness: Secondary | ICD-10-CM | POA: Insufficient documentation

## 2022-03-09 DIAGNOSIS — R569 Unspecified convulsions: Secondary | ICD-10-CM | POA: Diagnosis present

## 2022-03-09 LAB — CBC WITH DIFFERENTIAL/PLATELET
Abs Immature Granulocytes: 0.01 10*3/uL (ref 0.00–0.07)
Basophils Absolute: 0 10*3/uL (ref 0.0–0.1)
Basophils Relative: 0 %
Eosinophils Absolute: 0.2 10*3/uL (ref 0.0–0.5)
Eosinophils Relative: 4 %
HCT: 42.4 % (ref 39.0–52.0)
Hemoglobin: 15 g/dL (ref 13.0–17.0)
Immature Granulocytes: 0 %
Lymphocytes Relative: 30 %
Lymphs Abs: 1.6 10*3/uL (ref 0.7–4.0)
MCH: 30.2 pg (ref 26.0–34.0)
MCHC: 35.4 g/dL (ref 30.0–36.0)
MCV: 85.5 fL (ref 80.0–100.0)
Monocytes Absolute: 0.3 10*3/uL (ref 0.1–1.0)
Monocytes Relative: 5 %
Neutro Abs: 3.3 10*3/uL (ref 1.7–7.7)
Neutrophils Relative %: 61 %
Platelets: 158 10*3/uL (ref 150–400)
RBC: 4.96 MIL/uL (ref 4.22–5.81)
RDW: 10.9 % — ABNORMAL LOW (ref 11.5–15.5)
WBC: 5.5 10*3/uL (ref 4.0–10.5)
nRBC: 0 % (ref 0.0–0.2)

## 2022-03-09 LAB — COMPREHENSIVE METABOLIC PANEL
ALT: 23 U/L (ref 0–44)
AST: 23 U/L (ref 15–41)
Albumin: 4.6 g/dL (ref 3.5–5.0)
Alkaline Phosphatase: 59 U/L (ref 38–126)
Anion gap: 8 (ref 5–15)
BUN: 10 mg/dL (ref 6–20)
CO2: 26 mmol/L (ref 22–32)
Calcium: 9.6 mg/dL (ref 8.9–10.3)
Chloride: 105 mmol/L (ref 98–111)
Creatinine, Ser: 1.07 mg/dL (ref 0.61–1.24)
GFR, Estimated: >60 mL/min (ref >60)
Glucose, Bld: 87 mg/dL (ref 70–99)
Potassium: 4.8 mmol/L (ref 3.5–5.1)
Sodium: 139 mmol/L (ref 135–145)
Total Bilirubin: 0.8 mg/dL (ref 0.3–1.2)
Total Protein: 7.7 g/dL (ref 6.5–8.1)

## 2022-03-09 NOTE — ED Triage Notes (Signed)
"  Called EMS because he "felt like he was going to have a seizure".  Grandmother stated this may be related to issues with his mother because he has issues with her and comes to her house and this has happened before. He had an episode of not responding to voice and his eye lids were twitching while in route. We were almost to [facility] and patient woke up and stated he could not go to [facility] and had to go to [place] because of a "legal case" per EMS On arrival, patient A&O x 4. No seizure activity noted. No signs of distress.

## 2022-03-09 NOTE — ED Provider Notes (Signed)
[facility]-EMERGENCY DEPT Provider Note   CSN: [id number] Arrival date & time: 03/09/22  4854     History  Chief Complaint  Patient presents with   Near Syncope    Ruben Stone is a 26 y.o. male.  HPI     When moved to [facility] 2020, first started happening. When feeling great doesn't happen, happens when under stress. Thought could be seizures, [facility]. Seizures triggered by getting in own head.  Don't feel it in body until wake up. Feels weak, disoriented, hungry in a way. Last time had episode called Dad 1-2 months ago. Don't happen twice in same day.  Today had stressful situation, felt the weakness then with ambulance had eyelid twitching in route and doesn't, remember that part.  Not sure how long it lasted.  Don't remember getting in the ambulance. Living around [place]    Neurologist had recommended ?naproxen ? (Maybe neurontin?)  , now [employer], copay can maybe afford it.  Had head CT, wear headband/EEG. Had talked about doing CBD route. Was not prescribed seizure medications.   Past Medical History:  Diagnosis Date   Asthma    Bleeding from the nose    Depression    Seizures (HCC)     Home Medications Prior to Admission medications   Medication Sig Start Date End Date Taking? Authorizing Provider  albuterol (VENTOLIN HFA) 108 (90 Base) MCG/ACT inhaler Inhale 1-2 puffs into the lungs every 4 (four) hours as needed for wheezing or shortness of breath. 09/10/21   Marlinda Mike, Janace Aris, MD      Allergies    Patient has no known allergies.    Review of Systems   Review of Systems  Physical Exam Updated Vital Signs BP 124/66   Pulse 60   Temp 98.2 F (36.8 C) (Oral)   Resp 15   Ht 6' (1.829 m)   Wt 63.5 kg   SpO2 100%   BMI 18.99 kg/m  Physical Exam Vitals and nursing note reviewed.  Constitutional:      General: He is not in acute distress.    Appearance: Normal appearance. He is well-developed. He is not  ill-appearing or diaphoretic.  HENT:     Head: Normocephalic and atraumatic.  Eyes:     General: No visual field deficit.    Extraocular Movements: Extraocular movements intact.     Conjunctiva/sclera: Conjunctivae normal.     Pupils: Pupils are equal, round, and reactive to light.  Cardiovascular:     Rate and Rhythm: Normal rate and regular rhythm.     Pulses: Normal pulses.     Heart sounds: Normal heart sounds. No murmur heard.    No friction rub. No gallop.  Pulmonary:     Effort: Pulmonary effort is normal. No respiratory distress.     Breath sounds: Normal breath sounds. No wheezing or rales.  Abdominal:     General: There is no distension.     Palpations: Abdomen is soft.     Tenderness: There is no abdominal tenderness. There is no guarding.  Musculoskeletal:        General: No swelling or tenderness.     Cervical back: Normal range of motion.  Skin:    General: Skin is warm and dry.     Findings: No erythema or rash.  Neurological:     General: No focal deficit present.     Mental Status: He is alert and oriented to person, place, and time.  GCS: GCS eye subscore is 4. GCS verbal subscore is 5. GCS motor subscore is 6.     Cranial Nerves: No cranial nerve deficit, dysarthria or facial asymmetry.     Sensory: No sensory deficit.     Motor: No weakness or tremor.     Coordination: Coordination normal. Finger-Nose-Finger Test normal.     Gait: Gait normal.     ED Results / Procedures / Treatments   Labs (all labs ordered are listed, but only abnormal results are displayed) Labs Reviewed  CBC WITH DIFFERENTIAL/PLATELET - Abnormal; Notable for the following components:      Result Value   RDW 10.9 (*)    All other components within normal limits  COMPREHENSIVE METABOLIC PANEL    EKG EKG Interpretation  Date/Time:  Monday March 09 2022 10:46:06 EST Ventricular Rate:  65 PR Interval:  177 QRS Duration: 93 QT Interval:  391 QTC Calculation: 407 R  Axis:   93 Text Interpretation: Sinus rhythm Consider right ventricular hypertrophy ST elev, probable normal early repol pattern Similar V3 to prior Confirmed by Alvira Monday ([id number]) on 03/09/2022 11:57:05 AM  Radiology No results found.  Procedures Procedures    Medications Ordered in ED Medications - No data to display  ED Course/ Medical Decision Making/ A&P                            26yo male with history of depression, asthma, report of seizure-like activity who presents with concern for seizure like activity.  DDx includes syncope, seizure, non-epileptic seizures.    EKG not significantly changed from prior. No chest pain, no dyspnea, doubt cardiac syncope.    Labs completed and personally evaluated and interpreted by me show no evidence of significant anemia, leukocytosis,electrolyte abnormalities.    No trauma, neurologic exam WNL, doubt ICH, or other acute intracranial abnormalities.  Reports he has episodes of seizures induced by stress and event today was similar He saw Neurology near [place], reports having EEG, CT and was recommended (?naproxen or neurontin.)  Does not sound like he had loss of bowel/bladder/tongue biting or post-ictal period, however difficult to rule out seizure.  Discussed options including initiating anti-seizure medications and neurology follow up versus continued focus on minimizing stress/triggers and follow up given of stress induced events in the past.  He feels back to his baseline.  Reports he would like to continue follow up with Neurology but declines seizure medications at this time. Patient discharged in stable condition with understanding of reasons to return.          Final Clinical Impression(s) / ED Diagnoses Final diagnoses:  Seizure-like activity [facility])    Rx / DC Orders ED Discharge Orders     None         Alvira Monday, MD 03/09/22 2346

## 2024-02-23 ENCOUNTER — Ambulatory Visit
Admission: EM | Admit: 2024-02-23 | Discharge: 2024-02-23 | Disposition: A | Discharge status: Home / Self Care | Payer: Self-pay | Attending: Nurse Practitioner | Admitting: Nurse Practitioner

## 2024-02-23 DIAGNOSIS — S80211A Abrasion, right knee, initial encounter: Secondary | ICD-10-CM

## 2024-02-23 DIAGNOSIS — S60512A Abrasion of left hand, initial encounter: Secondary | ICD-10-CM

## 2024-02-23 NOTE — ED Triage Notes (Signed)
 Pt states he fell yesterday-abrasions to left palm from asphalt-NAD-steady gait

## 2024-02-23 NOTE — ED Provider Notes (Signed)
 [facility]    CSN: [id number] Arrival date & time: 02/23/24  1239      History   Chief Complaint Chief Complaint  Patient presents with   Fall    HPI Ruben Stone is a 28 y.o. male.   Discussed the use of AI scribe software for clinical note transcription with the patient, who gave verbal consent to proceed.   Patient presents with injuries to his left hand and right knee sustained in a fall yesterday. The patient reports that his sister was being chased by a dog, and when the dog came toward them, the patient tripped and fell on asphalt, sliding causing abrasions to her left hand and right knee but denies any head injury or dog bite. The patient reports no other injuries or pain elsewhere. He is up to date on tetanus vaccination, having received it less than 5 years ago.   The following sections of the patient's history were reviewed and updated as appropriate: allergies, current medications, past family history, past medical history, past social history, past surgical history, and problem list.     Past Medical History:  Diagnosis Date   Asthma    Bleeding from the nose    Depression    Seizures (HCC)     Patient Active Problem List   Diagnosis Date Noted   Abdominal pain 04/03/2017   Emesis 04/03/2017   Altered mental status, unspecified 04/03/2017   History of seizures 04/03/2017   Seizures (HCC)    Asthma     History reviewed. No pertinent surgical history.     Home Medications    Prior to Admission medications   Medication Sig Start Date End Date Taking? Authorizing Provider  albuterol  (VENTOLIN  HFA) 108 (90 Base) MCG/ACT inhaler Inhale 1-2 puffs into the lungs every 4 (four) hours as needed for wheezing or shortness of breath. 09/10/21   Vonna Sharlet POUR, MD    Family History No family history on file.  Social History Social History   Tobacco Use   Smoking status: Never    Passive exposure: Yes   Smokeless tobacco: Never  Vaping  Use   Vaping status: Never Used  Substance Use Topics   Alcohol use: No   Drug use: Yes    Types: Marijuana     Allergies   Patient has no known allergies.   Review of Systems Review of Systems  Musculoskeletal:  Negative for arthralgias, back pain and neck pain.  Skin:  Positive for wound.  Neurological:  Negative for dizziness, syncope, weakness and headaches.  All other systems reviewed and are negative.    Physical Exam Triage Vital Signs ED Triage Vitals  Encounter Vitals Group     BP 02/23/24 1342 105/68     Girls Systolic BP Percentile --      Girls Diastolic BP Percentile --      Boys Systolic BP Percentile --      Boys Diastolic BP Percentile --      Pulse Rate 02/23/24 1342 66     Resp 02/23/24 1342 20     Temp 02/23/24 1342 97.9 F (36.6 C)     Temp Source 02/23/24 1342 Oral     SpO2 02/23/24 1342 98 %     Weight --      Height --      Head Circumference --      Peak Flow --      Pain Score 02/23/24 1340 5     Pain  Loc --      Pain Education --      Exclude from Growth Chart --    No data found.  Updated Vital Signs BP 105/68 (BP Location: Left Arm)   Pulse 66   Temp 97.9 F (36.6 C) (Oral)   Resp 20   SpO2 98%   Visual Acuity Right Eye Distance:   Left Eye Distance:   Bilateral Distance:    Right Eye Near:   Left Eye Near:    Bilateral Near:     Physical Exam Vitals reviewed.  Constitutional:      General: He is awake. He is not in acute distress.    Appearance: Normal appearance. He is well-developed. He is not ill-appearing, toxic-appearing or diaphoretic.  HENT:     Head: Normocephalic.     Right Ear: Hearing normal.     Left Ear: Hearing normal.     Nose: Nose normal.     Mouth/Throat:     Mouth: Mucous membranes are moist.  Eyes:     General: Vision grossly intact.     Conjunctiva/sclera: Conjunctivae normal.  Cardiovascular:     Rate and Rhythm: Normal rate and regular rhythm.     Heart sounds: Normal heart sounds.   Pulmonary:     Effort: Pulmonary effort is normal.     Breath sounds: Normal breath sounds and air entry.  Musculoskeletal:        General: Normal range of motion.     Left hand: No swelling, deformity or tenderness. Normal range of motion. Normal strength. Normal sensation.     Cervical back: Full passive range of motion without pain, normal range of motion and neck supple.     Right knee: No swelling, deformity, effusion or erythema. Normal range of motion. No tenderness.  Skin:    General: Skin is warm and dry.     Findings: Abrasion and wound present.     Comments: Superficial open abrasion on the anterior right knee measuring approximately 2  2.5 cm with irregular margins. The wound bed is pink with areas of moist granulation tissue. The surrounding skin shows mild maceration with slight erythema but no warmth, induration, or fluctuance. No foreign bodies or active bleeding noted. On the palmar aspect of the left hand, two superficial wounds noted. The larger wound over the central palm measures approximately 2.5-3 cm in diameter with exposed pink granulation tissue and irregular margins. A second smaller lesion distal to the first measures approximately 1  1.5 cm and has a similar appearance. There is no evidence of purulence, necrotic tissue, or foreign bodies. Surrounding skin with mild maceration without significant erythema, warmth, or induration. No active bleeding is observed.  Neurological:     General: No focal deficit present.     Mental Status: He is alert and oriented to person, place, and time.  Psychiatric:        Speech: Speech normal.        Behavior: Behavior is cooperative.         UC Treatments / Results  Labs (all labs ordered are listed, but only abnormal results are displayed) Labs Reviewed - No data to display  EKG   Radiology No results found.  Procedures Wound Care  Date/Time: 02/23/2024 2:17 PM  Performed by: Withrow, Roxianne MATSU,  CMA Authorized by: Iola Lukes, FNP   Consent:    Consent obtained:  Verbal   Consent given by:  Patient   Risks, benefits, and alternatives were discussed:  yes     Risks discussed:  Bleeding, infection, pain and poor cosmetic result Universal protocol:    Patient identity confirmed:  Verbally with patient and arm band Sedation:    Sedation type:  None Anesthesia:    Anesthesia method:  None Post-procedure details:    Procedure completion:  Tolerated well, no immediate complications Comments:     Left palmar hand abrasion and right anterior knee abrasion were cleansed and gently dried. A thin layer of antibiotic ointment was applied, and non-stick dressings were placed and secured.   (including critical care time)  Medications Ordered in UC Medications - No data to display  Initial Impression / Assessment and Plan / UC Course  I have reviewed the triage vital signs and the nursing notes.  Pertinent labs & imaging results that were available during my care of the patient were reviewed by me and considered in my medical decision making (see chart for details).     The patient presents after a fall onto asphalt yesterday while avoiding a dog, resulting in abrasions to the left palm and right knee. Both injuries appear superficial with no signs of infection, foreign body, or deeper tissue involvement. Tetanus immunization is up to date within the past five years, and no booster is indicated. In clinic, both wounds were cleansed and appropriately dressed by nursing staff. The patient was advised to clean the wounds daily with mild soap and water, gently pat dry, apply a thin layer of antibiotic ointment, and cover with a non-stick dressing or large bandage, ensuring adhesive does not contact the wound bed. Allowing the wounds to air out when at home was recommended to promote healing.  Follow-up with the primary care provider was advised if wounds do not show steady improvement over  the next several days, develop increasing pain, redness, swelling, warmth, drainage, or signs of infection. Emergency evaluation was recommended for rapidly spreading redness, fever, significant swelling, uncontrolled bleeding, severe pain, loss of function of the hand or knee, or concern for retained debris or worsening injury.  Today's evaluation has revealed no signs of a dangerous process. Discussed diagnosis with patient and/or guardian. Patient and/or guardian aware of their diagnosis, possible red flag symptoms to watch out for and need for close follow up. Patient and/or guardian understands verbal and written discharge instructions. Patient and/or guardian comfortable with plan and disposition.  Patient and/or guardian has a clear mental status at this time, good insight into illness (after discussion and teaching) and has clear judgment to make decisions regarding their care  Documentation was completed with the aid of voice recognition software. Transcription may contain typographical errors.  Final Clinical Impressions(s) / UC Diagnoses   Final diagnoses:  Abrasion of palm of left hand, initial encounter  Abrasion of right knee, initial encounter     Discharge Instructions      You were seen for scrapes to the left hand and right knee after your fall. Both wounds are shallow surface abrasions with no signs of infection, and your tetanus shot is up to date. At home, clean each area once daily with mild soap and warm water, gently pat dry, apply a thin layer of antibiotic ointment such as bacitracin, and cover with a non-stick pad or large Band-Aid making sure the sticky edges do not touch the wound. When resting at home, you may allow the wounds to be open to air to help healing. Keep the areas clean and dry, and change the bandages if they become wet  or dirty. Mild soreness is normal; acetaminophen  or ibuprofen  may be used as needed for pain if safe for you. Follow up with your primary  care provider if the wounds are not improving over the next several days, become more painful, or show signs of redness, warmth, swelling, pus-like drainage, or delayed healing. Seek emergency care if you develop fever, rapidly spreading redness, severe pain, uncontrolled bleeding, trouble using the hand or bending the knee, or any sudden concerning change in the wounds or your condition.      ED Prescriptions   None    PDMP not reviewed this encounter.   Iola Lukes, [place] 02/23/24 [phone]

## 2024-02-23 NOTE — Discharge Instructions (Addendum)
 You were seen for scrapes to the left hand and right knee after your fall. Both wounds are shallow surface abrasions with no signs of infection, and your tetanus shot is up to date. At home, clean each area once daily with mild soap and warm water, gently pat dry, apply a thin layer of antibiotic ointment such as bacitracin, and cover with a non-stick pad or large Band-Aid making sure the sticky edges do not touch the wound. When resting at home, you may allow the wounds to be open to air to help healing. Keep the areas clean and dry, and change the bandages if they become wet or dirty. Mild soreness is normal; acetaminophen  or ibuprofen  may be used as needed for pain if safe for you. Follow up with your primary care provider if the wounds are not improving over the next several days, become more painful, or show signs of redness, warmth, swelling, pus-like drainage, or delayed healing. Seek emergency care if you develop fever, rapidly spreading redness, severe pain, uncontrolled bleeding, trouble using the hand or bending the knee, or any sudden concerning change in the wounds or your condition.

## 2024-03-18 ENCOUNTER — Ambulatory Visit (HOSPITAL_COMMUNITY)
Admission: EM | Admit: 2024-03-18 | Discharge: 2024-03-18 | Disposition: A | Discharge status: Home / Self Care | Attending: Urology | Admitting: Urology

## 2024-03-18 ENCOUNTER — Ambulatory Visit (HOSPITAL_COMMUNITY): Admission: EM | Admit: 2024-03-18 | Discharge: 2024-03-18 | Discharge status: Against Medical Advice

## 2024-03-18 DIAGNOSIS — F411 Generalized anxiety disorder: Secondary | ICD-10-CM

## 2024-03-18 MED ORDER — HYDROXYZINE HCL 25 MG PO TABS: 25.0000 mg | ORAL_TABLET | Freq: Two times a day (BID) | ORAL | 0 refills | Status: AC | PRN

## 2024-03-18 NOTE — Progress Notes (Signed)
" °   03/18/24 2214  [facility] Triage Screening (Walk-ins at [facility] only)  How Did You Hear About Us ? Self  What Is the Reason for Your Visit/Call Today? Pt presents to [facility] voluntarily with chief complaint of increased anxiety and worry that something bad is going to happen. Pt reports that he looked at an AI video of the moon crashing into the earth about 4 days ago and since seeing the video he has been having panic attacks and overthinking that something bad is going to happen. Pt reports poor sleep at night however reports sleeping an average of 6 hours a night. Pt reports THC use but he stopped using about 5 days ago. Pt reports taking ashwagandha gummies 2 days ago which provided him with some relief. Pt also reports relief from thoughts when he is busy. Pt denies SI, HI, AVH. Pt denies prior mental health history and denies a family history of mental illness. Pt has established care with [facility] and suppose to call them Monday to start therapy.  How Long Has This Been Causing You Problems? <Week  Have You Recently Had Any Thoughts About Hurting Yourself? No  Are You Planning to Commit Suicide/Harm Yourself At This time? No  Have you Recently Had Thoughts About Hurting Someone Sherral? No  Are You Planning To Harm Someone At This Time? No  Physical Abuse Denies  Verbal Abuse Denies  Sexual Abuse Denies  Exploitation of patient/patient's resources Denies  Self-Neglect Denies  Are you currently experiencing any auditory, visual or other hallucinations? No  Have You Used Any Alcohol or Drugs in the Past 24 Hours? No  Do you have any current medical co-morbidities that require immediate attention? No  Clinician description of patient physical appearance/behavior: calm  What Do You Feel Would Help You the Most Today? Treatment for Depression or other mood problem  If access to [facility] was not available, would you have sought care in the Emergency Department? No  Determination of  Need Routine (7 days)  Options For Referral Medication Management;Outpatient Therapy    "

## 2024-03-18 NOTE — Discharge Instructions (Addendum)

## 2024-03-18 NOTE — Progress Notes (Signed)
" °   03/18/24 1851  [facility] Triage Screening (Walk-ins at [facility] only)  How Did You Hear About Us ? Other (Comment) (Mobile Crisis)  What Is the Reason for Your Visit/Call Today? Ruben Stone is a 45Y male presenting to [facility] as a vol walk-in. Pt states he is here today due to anxiety and paranoia he has been having. Pt states he has been smoking everyday for the last 26yrs. Pt states he feels like he has devloped a phobia that anything can happen in this world due to being on social media while being high and seeing theories about the end of the world. Pt states he just wants to be at ease and find way to ease these feelings that he has been having. Pt denies SI, HI, AVH, and substance use in the last 24hrs.  How Long Has This Been Causing You Problems? <Week (4-5 days)  Have You Recently Had Any Thoughts About Hurting Yourself? No  Are You Planning to Commit Suicide/Harm Yourself At This time? No  Have you Recently Had Thoughts About Hurting Someone Sherral? No  Are You Planning To Harm Someone At This Time? No  Physical Abuse Denies  Verbal Abuse Denies  Sexual Abuse Denies  Exploitation of patient/patient's resources Denies  Are you currently experiencing any auditory, visual or other hallucinations? No  Have You Used Any Alcohol or Drugs in the Past 24 Hours? No  Do you have any current medical co-morbidities that require immediate attention? No  What Do You Feel Would Help You the Most Today? Treatment for Depression or other mood problem  Determination of Need Routine (7 days)  Options For Referral Outpatient Therapy;Medication Management;BH Urgent Care    "

## 2024-03-18 NOTE — ED Provider Notes (Cosign Needed Addendum)
 Behavioral Health Urgent Care Medical Screening Exam  Patient Name: Ruben Stone MRN: [id number] Date of Evaluation: 03/18/2024 Chief Complaint:   Diagnosis:  Final diagnoses:  Anxiety state    History of Present illness: Ruben Stone is a 29 y.o. male with a reported hx of anxiety and cannabis abuse presented voluntarily due to increased anxiety and excessive worry that something bad is going to happen. He described being chronically anxious and prone to overthinking, with his anxiety worsening over the past 4 days after watching an AI-generated video about the moon crashing into the Earth. He says he realized the scenario was scientifically impossible due to the laws of gravity but reported being unable to stop worrying about it. He denied any paranoia but acknowledged that he often fixates on life in general.  patient denied suicidal and homicidal ideation, auditory or visual hallucinations, and recent substance use. He reported last smoking marijuana approximately 5-6 days ago. Patient stated he is scheduled to see a therapist at [facility] on Monday for address his anxiety.   patient appeared anxious but cooperative during the assessment. He was alert and oriented x4, and engaged in the conversation. His mood was described as anxious, with an affect congruent to his mood. Thought processes were coherent and goal-directed. He denied delusions and hallucinations. Insight was limited. Judgment was intact, and he demonstrated no acute safety concerns.   Prescribe hydroxyzine  25mg  BID as needed for anxiety (14 tablets, 7-day supply). Recommend follow-up with the therapist at [facility] as scheduled for Monday. Encourage patient to practice stress-reducing techniques such as deep breathing or mindfulness to manage anxiety.   No evidence of imminent danger to self or others at this time. Patient does not meet criteria for psychiatric admission or IVC. Supportive  therapy provided about ongoing stressors. Discussed crisis plan, callling [phone] or going to Emergency Dept  Flowsheet Row ED from 03/18/2024 in [facility] Most recent reading at 03/18/2024 10:37 PM ED from 03/18/2024 in [facility] Most recent reading at 03/18/2024  6:51 PM UC from 02/23/2024 in [facility] at [employer]) Most recent reading at 02/23/2024  1:42 PM  C-SSRS RISK CATEGORY No Risk No Risk No Risk    Psychiatric Specialty Exam  Presentation  General Appearance:Appropriate for Environment  Eye Contact:Good  Speech:Clear and Coherent  Speech Volume:Normal  Handedness:Right   Mood and Affect  Mood: Anxious  Affect: Congruent   Thought Process  Thought Processes: Coherent  Descriptions of Associations:Intact  Orientation:Full (Time, Place and Person)  Thought Content:WDL    Hallucinations:None  Ideas of Reference:None  Suicidal Thoughts:No  Homicidal Thoughts:No   Sensorium  Memory: Immediate Good; Recent Good; Remote Good  Judgment: Intact  Insight: Fair   Art Therapist  Concentration: Good  Attention Span: Good  Recall: Good  Fund of Knowledge: Good  Language: Good   Psychomotor Activity  Psychomotor Activity: Normal   Assets  Assets: Communication Skills; Desire for Improvement; Housing; Physical Health; Transportation; Social Support   Sleep  Sleep: Fair  Number of hours:  6   Physical Exam: Physical Exam Vitals and nursing note reviewed.  Constitutional:      General: He is not in acute distress.    Appearance: He is well-developed.  HENT:     Head: Normocephalic and atraumatic.  Eyes:     Conjunctiva/sclera: Conjunctivae normal.  Cardiovascular:     Rate and Rhythm: Bradycardia present.  Pulmonary:  Effort: Pulmonary effort is normal.  Abdominal:     Palpations: Abdomen is soft.  Musculoskeletal:         General: Normal range of motion.     Cervical back: Normal range of motion.  Neurological:     Mental Status: He is alert and oriented to person, place, and time.  Psychiatric:        Attention and Perception: Attention and perception normal.        Mood and Affect: Mood is anxious.        Speech: Speech normal.        Behavior: Behavior normal. Behavior is cooperative.        Thought Content: Thought content normal.        Cognition and Memory: Cognition normal.    Review of Systems  Constitutional: Negative.   HENT: Negative.    Eyes: Negative.   Respiratory: Negative.    Cardiovascular: Negative.   Gastrointestinal: Negative.   Genitourinary: Negative.   Musculoskeletal: Negative.   Skin: Negative.   Neurological: Negative.   Endo/Heme/Allergies: Negative.   Psychiatric/Behavioral:  Positive for substance abuse. The patient is nervous/anxious.    Blood pressure 116/75, pulse (!) 51, temperature 98.2 F (36.8 C), temperature source Oral, resp. rate 18, SpO2 100%. There is no height or weight on file to calculate BMI.  Musculoskeletal: Strength & Muscle Tone: within normal limits Gait & Station: normal Patient leans: Right   [facility] MSE Discharge Disposition for Follow up and Recommendations: Based on my evaluation the patient does not appear to have an emergency medical condition and can be discharged with resources and follow up care in outpatient services for Medication Management and Individual Therapy   Prescribe hydroxyzine  25mg  BID as needed for anxiety (14 tablets, 7-day supply). Recommend follow-up with the therapist at [facility] as scheduled for Monday. Encourage patient to practice stress-reducing techniques such as deep breathing or mindfulness to manage anxiety.   Kathryne DELENA Show, NP 03/18/2024, 11:37 PM
